# Patient Record
Sex: Male | Born: 1970 | Race: White | Hispanic: No | Marital: Married | Smoking: Never smoker
Health system: Southern US, Community
[De-identification: ages and names within clinical notes are randomized; demographics above are authoritative.]

## PROBLEM LIST (undated history)

## (undated) DIAGNOSIS — J45909 Unspecified asthma, uncomplicated: Secondary | ICD-10-CM

## (undated) DIAGNOSIS — G43909 Migraine, unspecified, not intractable, without status migrainosus: Secondary | ICD-10-CM

## (undated) DIAGNOSIS — E119 Type 2 diabetes mellitus without complications: Secondary | ICD-10-CM

## (undated) DIAGNOSIS — Z87820 Personal history of traumatic brain injury: Secondary | ICD-10-CM

## (undated) DIAGNOSIS — R202 Paresthesia of skin: Secondary | ICD-10-CM

## (undated) DIAGNOSIS — E669 Obesity, unspecified: Secondary | ICD-10-CM

## (undated) DIAGNOSIS — C801 Malignant (primary) neoplasm, unspecified: Secondary | ICD-10-CM

## (undated) DIAGNOSIS — E785 Hyperlipidemia, unspecified: Secondary | ICD-10-CM

## (undated) DIAGNOSIS — R200 Anesthesia of skin: Secondary | ICD-10-CM

## (undated) DIAGNOSIS — T7840XA Allergy, unspecified, initial encounter: Secondary | ICD-10-CM

## (undated) DIAGNOSIS — R7301 Impaired fasting glucose: Secondary | ICD-10-CM

## (undated) HISTORY — DX: Personal history of traumatic brain injury: Z87.820

## (undated) HISTORY — DX: Allergy, unspecified, initial encounter: T78.40XA

## (undated) HISTORY — DX: Hyperlipidemia, unspecified: E78.5

## (undated) HISTORY — PX: EYE SURGERY: SHX253

## (undated) HISTORY — DX: Obesity, unspecified: E66.9

## (undated) HISTORY — DX: Impaired fasting glucose: R73.01

## (undated) HISTORY — DX: Paresthesia of skin: R20.2

## (undated) HISTORY — DX: Anesthesia of skin: R20.0

## (undated) HISTORY — DX: Type 2 diabetes mellitus without complications: E11.9

## (undated) HISTORY — DX: Migraine, unspecified, not intractable, without status migrainosus: G43.909

## (undated) HISTORY — PX: SPINE SURGERY: SHX786

---
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
## 1999-12-25 HISTORY — PX: NASAL SEPTUM SURGERY: SHX37

## 2002-11-04 ENCOUNTER — Encounter: Payer: Self-pay | Admitting: Emergency Medicine

## 2002-11-04 ENCOUNTER — Emergency Department (HOSPITAL_COMMUNITY): Admission: EM | Admit: 2002-11-04 | Discharge: 2002-11-04 | Payer: Self-pay | Admitting: Emergency Medicine

## 2003-11-12 ENCOUNTER — Emergency Department (HOSPITAL_COMMUNITY): Admission: EM | Admit: 2003-11-12 | Discharge: 2003-11-13 | Payer: Self-pay | Admitting: *Deleted

## 2005-11-19 ENCOUNTER — Encounter: Admission: RE | Admit: 2005-11-19 | Discharge: 2005-11-19 | Payer: Self-pay | Admitting: *Deleted

## 2005-12-07 ENCOUNTER — Ambulatory Visit (HOSPITAL_BASED_OUTPATIENT_CLINIC_OR_DEPARTMENT_OTHER): Admission: RE | Admit: 2005-12-07 | Discharge: 2005-12-07 | Payer: Self-pay | Admitting: Orthopedic Surgery

## 2005-12-07 ENCOUNTER — Ambulatory Visit (HOSPITAL_COMMUNITY): Admission: RE | Admit: 2005-12-07 | Discharge: 2005-12-07 | Payer: Self-pay | Admitting: Orthopedic Surgery

## 2005-12-24 HISTORY — PX: KNEE SURGERY: SHX244

## 2006-07-07 ENCOUNTER — Emergency Department: Payer: Self-pay | Admitting: Emergency Medicine

## 2006-09-24 ENCOUNTER — Emergency Department (HOSPITAL_COMMUNITY): Admission: EM | Admit: 2006-09-24 | Discharge: 2006-09-25 | Payer: Self-pay | Admitting: Emergency Medicine

## 2007-06-24 ENCOUNTER — Ambulatory Visit: Payer: Self-pay | Admitting: Internal Medicine

## 2007-07-11 ENCOUNTER — Ambulatory Visit: Payer: Self-pay | Admitting: Internal Medicine

## 2007-07-25 ENCOUNTER — Ambulatory Visit: Payer: Self-pay | Admitting: Internal Medicine

## 2007-08-25 ENCOUNTER — Ambulatory Visit: Payer: Self-pay | Admitting: Internal Medicine

## 2007-10-20 ENCOUNTER — Emergency Department: Payer: Self-pay | Admitting: Emergency Medicine

## 2007-10-20 ENCOUNTER — Other Ambulatory Visit: Payer: Self-pay

## 2007-10-25 ENCOUNTER — Ambulatory Visit: Payer: Self-pay | Admitting: Internal Medicine

## 2011-11-23 ENCOUNTER — Ambulatory Visit: Payer: Self-pay

## 2012-04-25 ENCOUNTER — Ambulatory Visit: Payer: Self-pay | Admitting: Emergency Medicine

## 2012-05-01 ENCOUNTER — Ambulatory Visit: Payer: Self-pay | Admitting: Urology

## 2012-10-06 ENCOUNTER — Ambulatory Visit: Payer: Self-pay

## 2013-03-04 ENCOUNTER — Emergency Department: Payer: Self-pay | Admitting: Emergency Medicine

## 2013-07-09 ENCOUNTER — Emergency Department: Payer: Self-pay | Admitting: Emergency Medicine

## 2013-07-09 LAB — BASIC METABOLIC PANEL
Anion Gap: 5 — ABNORMAL LOW (ref 7–16)
Chloride: 105 mmol/L (ref 98–107)
EGFR (African American): >60
EGFR (Non-African Amer.): >60
Glucose: 144 mg/dL — ABNORMAL HIGH (ref 65–99)
Osmolality: 276 (ref 275–301)
Potassium: 3.7 mmol/L (ref 3.5–5.1)

## 2013-07-09 LAB — CBC
HGB: 16.5 g/dL (ref 13.0–18.0)
MCH: 29.8 pg (ref 26.0–34.0)
MCHC: 34.5 g/dL (ref 32.0–36.0)
Platelet: 200 10*3/uL (ref 150–440)
RBC: 5.53 10*6/uL (ref 4.40–5.90)
RDW: 13.1 % (ref 11.5–14.5)

## 2013-07-09 LAB — TROPONIN I: Troponin-I: <0.02 ng/mL

## 2013-12-24 DIAGNOSIS — M5020 Other cervical disc displacement, unspecified cervical region: Secondary | ICD-10-CM

## 2013-12-24 HISTORY — DX: Other cervical disc displacement, unspecified cervical region: M50.20

## 2015-01-13 ENCOUNTER — Ambulatory Visit: Payer: Self-pay | Admitting: Family Medicine

## 2015-01-24 HISTORY — PX: LAMINECTOMY: SHX219

## 2015-01-25 ENCOUNTER — Other Ambulatory Visit: Payer: Self-pay | Admitting: Neurosurgery

## 2015-01-25 DIAGNOSIS — M5412 Radiculopathy, cervical region: Secondary | ICD-10-CM

## 2015-01-31 ENCOUNTER — Ambulatory Visit
Admission: RE | Admit: 2015-01-31 | Discharge: 2015-01-31 | Disposition: A | Discharge status: Home / Self Care | Payer: 59 | Source: Ambulatory Visit | Attending: Neurosurgery | Admitting: Neurosurgery

## 2015-01-31 ENCOUNTER — Ambulatory Visit
Admission: RE | Admit: 2015-01-31 | Discharge: 2015-01-31 | Disposition: A | Discharge status: Home / Self Care | Payer: Self-pay | Source: Ambulatory Visit | Attending: Neurosurgery | Admitting: Neurosurgery

## 2015-01-31 DIAGNOSIS — M5412 Radiculopathy, cervical region: Secondary | ICD-10-CM

## 2015-01-31 MED ORDER — DIAZEPAM 5 MG PO TABS
10.0000 mg | ORAL_TABLET | Freq: Once | ORAL | Status: AC
  Administered 2015-01-31: 10 mg via ORAL

## 2015-01-31 MED ORDER — IOHEXOL 300 MG/ML  SOLN
9.0000 mL | Freq: Once | INTRAMUSCULAR | Status: AC | PRN
  Administered 2015-01-31: 9 mL via INTRATHECAL

## 2015-01-31 NOTE — Discharge Instructions (Signed)

## 2015-06-15 DIAGNOSIS — R7301 Impaired fasting glucose: Secondary | ICD-10-CM | POA: Insufficient documentation

## 2015-06-15 DIAGNOSIS — E785 Hyperlipidemia, unspecified: Secondary | ICD-10-CM | POA: Insufficient documentation

## 2015-06-15 DIAGNOSIS — E1169 Type 2 diabetes mellitus with other specified complication: Secondary | ICD-10-CM | POA: Insufficient documentation

## 2015-06-15 DIAGNOSIS — G43909 Migraine, unspecified, not intractable, without status migrainosus: Secondary | ICD-10-CM | POA: Insufficient documentation

## 2015-06-15 DIAGNOSIS — E669 Obesity, unspecified: Secondary | ICD-10-CM | POA: Insufficient documentation

## 2015-06-22 ENCOUNTER — Encounter: Payer: Self-pay | Admitting: Family Medicine

## 2015-06-22 ENCOUNTER — Ambulatory Visit (INDEPENDENT_AMBULATORY_CARE_PROVIDER_SITE_OTHER): Payer: 59 | Admitting: Family Medicine

## 2015-06-22 VITALS — BP 117/78 | HR 69 | Temp 98.2°F | Wt 259.0 lb

## 2015-06-22 DIAGNOSIS — R634 Abnormal weight loss: Secondary | ICD-10-CM

## 2015-06-22 DIAGNOSIS — R200 Anesthesia of skin: Secondary | ICD-10-CM

## 2015-06-22 DIAGNOSIS — E669 Obesity, unspecified: Secondary | ICD-10-CM | POA: Diagnosis not present

## 2015-06-22 DIAGNOSIS — E785 Hyperlipidemia, unspecified: Secondary | ICD-10-CM | POA: Diagnosis not present

## 2015-06-22 DIAGNOSIS — R7301 Impaired fasting glucose: Secondary | ICD-10-CM | POA: Diagnosis not present

## 2015-06-22 DIAGNOSIS — M542 Cervicalgia: Secondary | ICD-10-CM

## 2015-06-22 DIAGNOSIS — R208 Other disturbances of skin sensation: Secondary | ICD-10-CM

## 2015-06-22 NOTE — Patient Instructions (Addendum)
Return in 6 months Try to limit saturated fats and sweets and simple carbs We recommend no more than 3 eggs per week Flu shot recommended in the Fall I've put in a referral to Dr. Annette Stable for the numbness in the left thumb, neck tightness and pain

## 2015-06-22 NOTE — Assessment & Plan Note (Addendum)
Check lipids today; no meds; encouraged fewer saturated fats

## 2015-06-22 NOTE — Assessment & Plan Note (Signed)
Check glucose today and A1C

## 2015-06-22 NOTE — Progress Notes (Signed)
BP 117/78 mmHg  Pulse 69  Temp(Src) 98.2 F (36.8 C)  Wt 259 lb (117.482 kg)  SpO2 98%   Subjective:    Patient ID: Ethan Perez, male    DOB: 19-May-1971, 44 y.o.   MRN: [id number]  HPI: Ethan Perez is a 44 y.o. male  Chief Complaint  Patient presents with  . Hyperlipidemia  . Hyperglycemia    IFG   He was doing diet and exercise; he stopped about a month ago; he has lost 40 pounds or so; he did a 3 day diet; he was doing carb restriction; he doesn't think he has cancer, no blood in th stool; he worked hard to lose the weight; followed a plan and stuck with it and all expected weight loss; stopped one month ago; weight leveled off, he fluctuates five pounds either way now, but staying steady  He stopped taking the blood pressure medicine (was taking that for headaches); 100/56 and felt light-headed and passed out, 2 months ago; he saw the surgeon and he thoughts that is what it was; stopped the BP medicine and have felt fine since then; had only had two headaches since stopping the med  High cholesterol; still eating foods with saturated fats; no bologna; occasional bacon; occasional hamburger; not much fried chicken, mostly grilled or baked; eggs every morning; he is fasting now  High glucose, prediabetes; not checking sugars; no dry mouth, no unusual excessive thirst  Relevant past medical, surgical, family and social history reviewed and updated as indicated. Interim medical history since our last visit reviewed. Allergies and medications reviewed and updated.  Review of Systems  Constitutional: Negative for unexpected weight change (patient worked hard for this).  Respiratory: Negative for shortness of breath.   Cardiovascular: Negative for chest pain.  Gastrointestinal: Negative for blood in stool.  Genitourinary: Negative for hematuria.   Per HPI unless specifically indicated above     Objective:    BP 117/78 mmHg  Pulse 69  Temp(Src) 98.2 F (36.8  C)  Wt 259 lb (117.482 kg)  SpO2 98%  Wt Readings from Last 3 Encounters:  06/22/15 259 lb (117.482 kg)  03/07/15 293 lb (132.904 kg)  01/31/15 300 lb (136.079 kg)    Physical Exam  Constitutional: He appears well-developed and well-nourished. No distress.  Weight loss noted  HENT:  Head: Normocephalic and atraumatic.  Nose: No rhinorrhea.  Mouth/Throat: Mucous membranes are normal.  Eyes: EOM are normal. No scleral icterus.  Neck: No thyromegaly present.  Cardiovascular: Normal rate, regular rhythm and normal heart sounds.   No murmur heard. Pulmonary/Chest: Effort normal and breath sounds normal. No respiratory distress. He has no wheezes.  Abdominal: Soft. Bowel sounds are normal. He exhibits no distension. There is no tenderness.  Musculoskeletal: He exhibits no edema.  Neurological: He is alert. He displays no tremor. He exhibits normal muscle tone.  Thumb to pinky strength 5/5, thumb to index finger strength 5/5; normal gait  Skin: Skin is warm and dry. No rash noted. He is not diaphoretic. No cyanosis.  Psychiatric: He has a normal mood and affect. His behavior is normal. Judgment and thought content normal.  Nursing note and vitals reviewed.     Assessment & Plan:   Problem List Items Addressed This Visit      Endocrine   IFG (impaired fasting glucose) - Primary    Check glucose today and A1C      Relevant Orders   Comprehensive metabolic panel   Hgb  A1c w/o eAG     Other   Obesity    Significant weight loss following what sounds like a pretty strict low calorie diet; will check TSH today; he denies red flags otherwise for cancer and believes the weight loss is explainable by what he did intentionally; he is still in the obese category, but much much improved; this is the single best thing he can do to decrease risk of developing diabetes      Hyperlipidemia    Check lipids today; no meds; encouraged fewer saturated fats      Relevant Orders   Lipid Panel  w/o Chol/HDL Ratio    Other Visit Diagnoses    Recent weight loss        patient denies red flags, does not think he has cancer, worked hard to achieve this weight loss; will check TSH anyway    Relevant Orders    TSH    Neck pain, bilateral        refer back to Dr. Annette Stable, neurosurgeon, at patient's request    Relevant Orders    Ambulatory referral to Neurosurgery    Numbness of left thumb        refer back to Dr. Annette Stable, neurosurgeon since he is having neck pain and left sided numbness; I'll check a TSH too    Relevant Orders    Ambulatory referral to Neurosurgery       Orders Placed This Encounter  Procedures  . Lipid Panel w/o Chol/HDL Ratio  . TSH  . Comprehensive metabolic panel  . Hgb A1c w/o eAG  . Ambulatory referral to Neurosurgery   Follow up plan: Return in about 6 months (around 12/22/2015) for prediabetes, high cholesterol.

## 2015-06-22 NOTE — Assessment & Plan Note (Signed)
Significant weight loss following what sounds like a pretty strict low calorie diet; will check TSH today; he denies red flags otherwise for cancer and believes the weight loss is explainable by what he did intentionally; he is still in the obese category, but much much improved; this is the single best thing he can do to decrease risk of developing diabetes

## 2015-06-23 LAB — COMPREHENSIVE METABOLIC PANEL
ALT: 19 IU/L (ref 0–44)
AST: 19 IU/L (ref 0–40)
Albumin/Globulin Ratio: 1.5 (ref 1.1–2.5)
Albumin: 4.3 g/dL (ref 3.5–5.5)
Alkaline Phosphatase: 76 IU/L (ref 39–117)
BUN / CREAT RATIO: 8 — AB (ref 9–20)
BUN: 7 mg/dL (ref 6–24)
Bilirubin Total: 0.6 mg/dL (ref 0.0–1.2)
CALCIUM: 9.7 mg/dL (ref 8.7–10.2)
CHLORIDE: 98 mmol/L (ref 97–108)
CO2: 27 mmol/L (ref 18–29)
Creatinine, Ser: 0.92 mg/dL (ref 0.76–1.27)
GFR calc Af Amer: 117 mL/min/{1.73_m2} (ref >59)
GFR, EST NON AFRICAN AMERICAN: 102 mL/min/{1.73_m2} (ref >59)
Globulin, Total: 2.9 g/dL (ref 1.5–4.5)
Glucose: 94 mg/dL (ref 65–99)
Potassium: 4.4 mmol/L (ref 3.5–5.2)
Sodium: 138 mmol/L (ref 134–144)
TOTAL PROTEIN: 7.2 g/dL (ref 6.0–8.5)

## 2015-06-23 LAB — LIPID PANEL W/O CHOL/HDL RATIO
Cholesterol, Total: 195 mg/dL (ref 100–199)
HDL: 34 mg/dL — ABNORMAL LOW (ref >39)
LDL Calculated: 128 mg/dL — ABNORMAL HIGH (ref 0–99)
Triglycerides: 167 mg/dL — ABNORMAL HIGH (ref 0–149)
VLDL CHOLESTEROL CAL: 33 mg/dL (ref 5–40)

## 2015-06-23 LAB — TSH: TSH: 1.67 u[IU]/mL (ref 0.450–4.500)

## 2015-06-23 LAB — HGB A1C W/O EAG: HEMOGLOBIN A1C: 6.1 % — AB (ref 4.8–5.6)

## 2015-06-28 ENCOUNTER — Telehealth: Payer: Self-pay

## 2015-06-28 NOTE — Telephone Encounter (Signed)
He called asking about his lab results.

## 2015-06-29 NOTE — Telephone Encounter (Signed)
Thank you so much Let patient know that I'm very pleased with his labs overall He has brought his 3 month average blood sugar down from 6.8 to 6.1; that's fantastic He has brought his HDL up from 31 to 34, also a big move in the right direction His triglycerides have also come down His LDL went up some, not sure if related to some animal fat during his diet ?, but it's reasonable (under 130); ideally, we'd like his LDL under 100, so encourage less animal fats which are usually saturated fats (cheese, bacon, sausage, pepperoni, hamburger, steak, etc.) Next labs can be in 6 months since he is doing so well

## 2015-06-29 NOTE — Telephone Encounter (Signed)
His lab results from 03/07/15: HgbA1c: 6.8% Glucose: 111 Total Chol: 176 Triglycerides: 171 HDL: 31 LDL: 111

## 2015-06-29 NOTE — Telephone Encounter (Signed)
Patient notified

## 2015-06-29 NOTE — Telephone Encounter (Signed)
Amy, I can't get into [facility]. Could you please pull up his last lipid panel, glucose, and A1C so I can compare these for him? Thank you

## 2015-07-01 ENCOUNTER — Telehealth: Payer: Self-pay | Admitting: Family Medicine

## 2015-07-26 ENCOUNTER — Other Ambulatory Visit: Payer: Self-pay | Admitting: Neurosurgery

## 2015-07-26 DIAGNOSIS — M542 Cervicalgia: Secondary | ICD-10-CM

## 2015-07-28 ENCOUNTER — Ambulatory Visit
Admission: RE | Admit: 2015-07-28 | Discharge: 2015-07-28 | Disposition: A | Discharge status: Home / Self Care | Payer: 59 | Source: Ambulatory Visit | Attending: Neurosurgery | Admitting: Neurosurgery

## 2015-07-28 DIAGNOSIS — M542 Cervicalgia: Secondary | ICD-10-CM

## 2015-12-21 ENCOUNTER — Ambulatory Visit (INDEPENDENT_AMBULATORY_CARE_PROVIDER_SITE_OTHER): Payer: 59 | Admitting: Family Medicine

## 2015-12-21 ENCOUNTER — Encounter: Payer: Self-pay | Admitting: Family Medicine

## 2015-12-21 VITALS — BP 133/84 | HR 71 | Temp 98.0°F | Wt 285.0 lb

## 2015-12-21 DIAGNOSIS — E669 Obesity, unspecified: Secondary | ICD-10-CM | POA: Diagnosis not present

## 2015-12-21 DIAGNOSIS — R7301 Impaired fasting glucose: Secondary | ICD-10-CM

## 2015-12-21 DIAGNOSIS — Z23 Encounter for immunization: Secondary | ICD-10-CM

## 2015-12-21 DIAGNOSIS — D492 Neoplasm of unspecified behavior of bone, soft tissue, and skin: Secondary | ICD-10-CM

## 2015-12-21 DIAGNOSIS — D485 Neoplasm of uncertain behavior of skin: Secondary | ICD-10-CM | POA: Diagnosis not present

## 2015-12-21 DIAGNOSIS — E785 Hyperlipidemia, unspecified: Secondary | ICD-10-CM | POA: Diagnosis not present

## 2015-12-21 NOTE — Assessment & Plan Note (Addendum)
Check A1c and glucose today; healthy eating encouraged; his weight gain is concerning, and weight loss was encouraged

## 2015-12-21 NOTE — Patient Instructions (Addendum)
I'll recommend trying to eat no more than three egg yolks a week Try to limit saturated fats in your diet (bologna, hot dogs, barbeque, cheeseburgers, hamburgers, steak, bacon, sausage, cheese, etc.) and get more fresh fruits, vegetables, and whole grains Check out the information at familydoctor.org entitled "What It Takes to Lose Weight" Try to lose between 1-2 pounds per week by taking in fewer calories and burning off more calories You can succeed by limiting portions, limiting foods dense in calories and fat, becoming more active, and drinking 8 glasses of water a day (64 ounces) Don't skip meals, especially breakfast, as skipping meals may alter your metabolism Do not use over-the-counter weight loss pills or gimmicks that claim rapid weight loss A healthy BMI (or body mass index) is between 18.5 and 24.9 You can calculate your ideal BMI at the [place] website [employer]fr Try to use a tiny dab or lamisil or lotrimin on the spot on your cheek for the next week We'll get you in to see dermatologist If you increase your activity, start slowly and build up gradually You received the flu shot today; it should protect you against the flu virus over the coming months; it will take about two weeks for antibodies to develop; do try to stay away from hospitals, nursing homes, and daycares during peak flu season; taking extra vitamin C daily during flu season may help you avoid getting sick

## 2015-12-21 NOTE — Assessment & Plan Note (Addendum)
Healthy eating encouraged but he does not sound motivated to change his diet, check lipids today; more fiber gently recommended

## 2015-12-21 NOTE — Progress Notes (Signed)
BP 133/84 mmHg  Pulse 71  Temp(Src) 98 F (36.7 C)  Wt 285 lb (129.275 kg)  SpO2 96%   Subjective:    Patient ID: Ethan Perez, male    DOB: Aug 06, 1971, 44 y.o.   MRN: [id number]  HPI: Ethan Perez is a 44 y.o. male  Chief Complaint  Patient presents with  . IFG    I'm just here for my recheck  . Hyperlipidemia   Since last visit, went to urgent care; neck problems; then saw Dr. Annette Stable; sprain in the neck and all better; hit a pole while trying to concentrate on something else  Prediabetes; mother, brother, lots of people on mother's side had diabetes; father's side okay without diabetes; some lemonade, but not much, no soft drinks; trying to do wheat; does like potatoes; he is fasting  High cholesterol; two eggs a day sometimes, maybe ten a day; normally has ham in the omelet; not milk drinker, but rarely skim milk; does love his cheese  Obesity; has gained weight; hit his head and healthy again and back again; threw a monkey wrench in his exercising but he has hopes to get back into it soon  Allergies; taking zyrtec pretty much every day that he works  Place on right cheek just appeared a week ago; a little itching, tried nothing  Relevant past medical, surgical, family and social history reviewed and updated as indicated. Interim medical history since our last visit reviewed. Allergies and medications reviewed and updated.  Review of Systems Per HPI unless specifically indicated above     Objective:    BP 133/84 mmHg  Pulse 71  Temp(Src) 98 F (36.7 C)  Wt 285 lb (129.275 kg)  SpO2 96%  Wt Readings from Last 3 Encounters:  12/21/15 285 lb (129.275 kg)  07/28/15 255 lb (115.667 kg)  06/22/15 259 lb (117.482 kg)   body mass index is 34.71 kg/(m^2).  Physical Exam  Constitutional: He appears well-developed and well-nourished.  Weight gain of 30 pounds in less than 5 months  HENT:  Head: Normocephalic and atraumatic.  Mouth/Throat: Oropharynx is  clear and moist.  Eyes: EOM are normal. No scleral icterus.  Neck: No thyromegaly present.  Cardiovascular: Normal rate and regular rhythm.   Pulmonary/Chest: Effort normal and breath sounds normal.  Abdominal: He exhibits no distension.  Musculoskeletal: Normal range of motion.  Neurological: He is alert.  Skin: Skin is warm. Lesion (right cheek, irregular erythematous lesion (under the right eye)) noted. No pallor.  Psychiatric: He has a normal mood and affect.      Assessment & Plan:   Problem List Items Addressed This Visit      Endocrine   IFG (impaired fasting glucose) - Primary    Check A1c and glucose today; healthy eating encouraged; his weight gain is concerning, and weight loss was encouraged      Relevant Orders   Comprehensive metabolic panel (Completed)   Hgb A1c w/o eAG (Completed)     Other   Obesity    Encouragement given to work on weight loss; he does not sound incredibly motivated to change his diet; if he starts to increase activity, start low and build up slowly      Hyperlipidemia    Healthy eating encouraged but he does not sound motivated to change his diet, check lipids today; more fiber gently recommended      Relevant Orders   Lipid Panel w/o Chol/HDL Ratio (Completed)    Other Visit Diagnoses  Needs flu shot        flu shot given by CMA    Encounter for immunization        Neoplasm of skin of right cheek        location is one where an AK might develop; unusual if fungal infection; will refer to derm    Relevant Orders    Ambulatory referral to Dermatology       Follow up plan: Return in about 6 months (around 06/20/2016) for thirty minute follow-up with fasting labs.  Orders Placed This Encounter  Procedures  . Flu Vaccine QUAD 36+ mos IM  . Lipid Panel w/o Chol/HDL Ratio  . Comprehensive metabolic panel  . Hgb A1c w/o eAG  . Ambulatory referral to Dermatology   An after-visit summary was printed and given to the patient at  [facility].  Please see the patient instructions which may contain other information and recommendations beyond what is mentioned above in the assessment and plan.

## 2015-12-22 ENCOUNTER — Encounter: Payer: Self-pay | Admitting: Family Medicine

## 2015-12-22 LAB — COMPREHENSIVE METABOLIC PANEL
ALT: 37 IU/L (ref 0–44)
AST: 26 IU/L (ref 0–40)
Albumin/Globulin Ratio: 1.4 (ref 1.1–2.5)
Albumin: 4.3 g/dL (ref 3.5–5.5)
Alkaline Phosphatase: 74 IU/L (ref 39–117)
BUN/Creatinine Ratio: 11 (ref 9–20)
BUN: 10 mg/dL (ref 6–24)
Bilirubin Total: 0.6 mg/dL (ref 0.0–1.2)
CALCIUM: 9.5 mg/dL (ref 8.7–10.2)
CO2: 23 mmol/L (ref 18–29)
CREATININE: 0.95 mg/dL (ref 0.76–1.27)
Chloride: 101 mmol/L (ref 96–106)
GFR calc Af Amer: 112 mL/min/{1.73_m2} (ref >59)
GFR, EST NON AFRICAN AMERICAN: 97 mL/min/{1.73_m2} (ref >59)
GLOBULIN, TOTAL: 3 g/dL (ref 1.5–4.5)
GLUCOSE: 99 mg/dL (ref 65–99)
POTASSIUM: 4.6 mmol/L (ref 3.5–5.2)
SODIUM: 139 mmol/L (ref 134–144)
Total Protein: 7.3 g/dL (ref 6.0–8.5)

## 2015-12-22 LAB — LIPID PANEL W/O CHOL/HDL RATIO
Cholesterol, Total: 196 mg/dL (ref 100–199)
HDL: 39 mg/dL — ABNORMAL LOW (ref >39)
LDL Calculated: 121 mg/dL — ABNORMAL HIGH (ref 0–99)
Triglycerides: 180 mg/dL — ABNORMAL HIGH (ref 0–149)
VLDL CHOLESTEROL CAL: 36 mg/dL (ref 5–40)

## 2015-12-22 LAB — HGB A1C W/O EAG: HEMOGLOBIN A1C: 5.7 % — AB (ref 4.8–5.6)

## 2015-12-24 NOTE — Assessment & Plan Note (Signed)
Encouragement given to work on weight loss; he does not sound incredibly motivated to change his diet; if he starts to increase activity, start low and build up slowly

## 2016-02-29 ENCOUNTER — Encounter: Payer: Self-pay | Admitting: *Deleted

## 2016-02-29 ENCOUNTER — Ambulatory Visit
Admission: EM | Admit: 2016-02-29 | Discharge: 2016-02-29 | Disposition: A | Discharge status: Home / Self Care | Payer: 59 | Attending: Family Medicine | Admitting: Family Medicine

## 2016-02-29 DIAGNOSIS — J0100 Acute maxillary sinusitis, unspecified: Secondary | ICD-10-CM | POA: Diagnosis not present

## 2016-02-29 DIAGNOSIS — R040 Epistaxis: Secondary | ICD-10-CM

## 2016-02-29 MED ORDER — ALBUTEROL SULFATE HFA 108 (90 BASE) MCG/ACT IN AERS: 1.0000 | INHALATION_SPRAY | Freq: Four times a day (QID) | RESPIRATORY_TRACT | Status: DC | PRN

## 2016-02-29 MED ORDER — AMOXICILLIN 875 MG PO TABS: 875.0000 mg | ORAL_TABLET | Freq: Two times a day (BID) | ORAL | Status: DC

## 2016-02-29 NOTE — ED Provider Notes (Signed)
CSN: [id number]     Arrival date & time 02/29/16  [id number] History   First MD Initiated Contact with Patient 02/29/16 1954     Chief Complaint  Patient presents with  . Epistaxis  . Headache   (Consider location/radiation/quality/duration/timing/severity/associated sxs/prior Treatment) Patient is a 45 y.o. male presenting with URI. The history is provided by the patient.  URI Presenting symptoms: congestion, cough, facial pain and fatigue   Fatigue:    Severity:  Moderate   Duration:  2 weeks   Timing:  Constant   Progression:  Worsening Severity:  Moderate Onset quality:  Sudden Duration:  2 weeks Timing:  Constant Progression:  Worsening Chronicity:  New Relieved by:  Nothing Ineffective treatments:  OTC medications Associated symptoms: headaches and sinus pain   Associated symptoms comment:  Intermittent nose bleeds (after blowing nose very hard) Risk factors: not elderly, no chronic cardiac disease, no chronic kidney disease, no chronic respiratory disease, no diabetes mellitus, no immunosuppression, no recent illness, no recent travel and no sick contacts     Past Medical History  Diagnosis Date  . Migraines   . IFG (impaired fasting glucose)   . Obesity   . Hyperlipidemia   . History of concussion   . Numbness and tingling of left thumb    Past Surgical History  Procedure Laterality Date  . Tonsillectomy  1976  . Nasal septum surgery  2001  . Laminectomy  Feb 2016  . Knee surgery Left 2007    Orthoscopic   Family History  Problem Relation Age of Onset  . Cirrhosis Mother     on diaylsis  . Seizures Mother     s/p hit her head  . Diabetes Mother   . Hypertension Mother   . Kidney disease Mother   . Migraines Father     with aura  . Diabetes Brother   . Diabetes Maternal Grandmother   . Heart disease Maternal Grandmother   . Diabetes Maternal Grandfather   . Heart disease Maternal Grandfather   . Stroke Maternal Grandfather   . Heart disease Paternal  Grandmother   . Heart disease Paternal Grandfather   . Lung disease Paternal Grandfather     worked in a [employer] park manager  . Alcohol abuse Paternal Grandfather   . Cancer Paternal Aunt    Social History  Substance Use Topics  . Smoking status: Never Smoker   . Smokeless tobacco: Never Used  . Alcohol Use: No    Review of Systems  Constitutional: Positive for fatigue.  HENT: Positive for congestion.   Respiratory: Positive for cough.   Neurological: Positive for headaches.    Allergies  Tramadol and Compazine  Home Medications   Prior to Admission medications   Medication Sig Start Date End Date Taking? Authorizing Provider  aspirin-acetaminophen-caffeine (EXCEDRIN MIGRAINE) [phone] MG per tablet Take 2 tablets by mouth every 6 (six) hours as needed for headache.   Yes Historical Provider, MD  cetirizine (ZYRTEC) 10 MG tablet Take 10 mg by mouth daily as needed for allergies.    Yes Historical Provider, MD  albuterol (PROVENTIL HFA;VENTOLIN HFA) 108 (90 Base) MCG/ACT inhaler Inhale 1-2 puffs into the lungs every 6 (six) hours as needed for wheezing or shortness of breath. 02/29/16   Norval Gable, MD  amoxicillin (AMOXIL) 875 MG tablet Take 1 tablet (875 mg total) by mouth 2 (two) times daily. 02/29/16   Norval Gable, MD  triamcinolone cream (KENALOG) 0.5 % Apply 1 application topically 3 (three) times daily.  Historical Provider, MD   Meds Ordered and Administered this Visit  Medications - No data to display  BP 134/86 mmHg  Pulse 88  Temp(Src) 97.7 F (36.5 C) (Oral)  Resp 16  Ht 6\' 4"  (1.93 m)  Wt 283 lb (128.368 kg)  BMI 34.46 kg/m2  SpO2 96% No data found.   Physical Exam  Constitutional: He appears well-developed and well-nourished. No distress.  HENT:  Head: Normocephalic and atraumatic.  Right Ear: Tympanic membrane, external ear and ear canal normal.  Left Ear: Tympanic membrane, external ear and ear canal normal.  Nose: Epistaxis (dried blood in anterior  nares bilaterally) is observed. Right sinus exhibits maxillary sinus tenderness and frontal sinus tenderness. Left sinus exhibits maxillary sinus tenderness and frontal sinus tenderness.  Mouth/Throat: Uvula is midline, oropharynx is clear and moist and mucous membranes are normal. No oropharyngeal exudate, posterior oropharyngeal edema, posterior oropharyngeal erythema or tonsillar abscesses.  Eyes: Conjunctivae and EOM are normal. Pupils are equal, round, and reactive to light. Right eye exhibits no discharge. Left eye exhibits no discharge. No scleral icterus.  Neck: Normal range of motion. Neck supple. No tracheal deviation present. No thyromegaly present.  Cardiovascular: Normal rate, regular rhythm and normal heart sounds.   Pulmonary/Chest: Effort normal and breath sounds normal. No stridor. No respiratory distress. He has no wheezes. He has no rales. He exhibits no tenderness.  Lymphadenopathy:    He has no cervical adenopathy.  Neurological: He is alert.  Skin: Skin is warm and dry. No rash noted. He is not diaphoretic.  Nursing note and vitals reviewed.   ED Course  Procedures (including critical care time)  Labs Review Labs Reviewed - No data to display  Imaging Review No results found.   Visual Acuity Review  Right Eye Distance:   Left Eye Distance:   Bilateral Distance:    Right Eye Near:   Left Eye Near:    Bilateral Near:         MDM   1. Acute maxillary sinusitis, recurrence not specified   2. Epistaxis     Discharge Medication List as of 02/29/2016  8:15 PM    START taking these medications   Details  albuterol (PROVENTIL HFA;VENTOLIN HFA) 108 (90 Base) MCG/ACT inhaler Inhale 1-2 puffs into the lungs every 6 (six) hours as needed for wheezing or shortness of breath., Starting 02/29/2016, Until Discontinued, Normal    amoxicillin (AMOXIL) 875 MG tablet Take 1 tablet (875 mg total) by mouth 2 (two) times daily., Starting 02/29/2016, Until Discontinued, Normal        1. diagnosis reviewed with patient 2. rx as per orders above; reviewed possible side effects, interactions, risks and benefits  3. Recommend supportive treatment with saline nasal rinses; Afrin prn nosebleed; otc analgesics prn 4. Follow-up prn if symptoms worsen or don't improve      Norval Gable, MD 02/29/16 2036

## 2016-02-29 NOTE — ED Notes (Signed)
Intermittent nosebleed for past 2-3 weeks. Tonight headache then nosebleed.

## 2016-03-15 ENCOUNTER — Encounter: Payer: Self-pay | Admitting: Emergency Medicine

## 2016-03-15 ENCOUNTER — Ambulatory Visit
Admission: EM | Admit: 2016-03-15 | Discharge: 2016-03-15 | Disposition: A | Discharge status: Home / Self Care | Payer: 59 | Attending: Emergency Medicine | Admitting: Emergency Medicine

## 2016-03-15 DIAGNOSIS — J324 Chronic pansinusitis: Secondary | ICD-10-CM

## 2016-03-15 MED ORDER — DOXYCYCLINE HYCLATE 100 MG PO CAPS: 100.0000 mg | ORAL_CAPSULE | Freq: Two times a day (BID) | ORAL | Status: DC

## 2016-03-15 MED ORDER — MOMETASONE FUROATE 50 MCG/ACT NA SUSP: 2.0000 | Freq: Every day | NASAL | Status: DC

## 2016-03-15 NOTE — ED Provider Notes (Signed)
HPI  SUBJECTIVE:  Ethan Perez is a 45 y.o. male who presents with nasal congestion, purulent nasal congestion, postnasal drip, ST, nonproductive cough, left more than right frontal sinus pain/pressure for 6 weeks. Reports intermittent bilateral ear pain, and states that his upper teeth hurt from time to time. He reports itchy, watery eyes, sneezing. He is taking Zyrtec on a regular basis for this. He was seen here on 3/80, found to have sinusitis, was treated with amoxicillin. Patient states that he got somewhat better, but then got worse again.  No fevers,  other HA. If not doing saline nasal irrigation or using a nasal steroid. States feels simlar to prev episodes of sinusitis. PMH seasonal allergies, sinus infections. No h/o DM. Pt is not a smoker.    Past Medical History  Diagnosis Date  . Migraines   . IFG (impaired fasting glucose)   . Obesity   . Hyperlipidemia   . History of concussion   . Numbness and tingling of left thumb     Past Surgical History  Procedure Laterality Date  . Tonsillectomy  1976  . Nasal septum surgery  2001  . Laminectomy  Feb 2016  . Knee surgery Left 2007    Orthoscopic    Family History  Problem Relation Age of Onset  . Cirrhosis Mother     on diaylsis  . Seizures Mother     s/p hit her head  . Diabetes Mother   . Hypertension Mother   . Kidney disease Mother   . Migraines Father     with aura  . Diabetes Brother   . Diabetes Maternal Grandmother   . Heart disease Maternal Grandmother   . Diabetes Maternal Grandfather   . Heart disease Maternal Grandfather   . Stroke Maternal Grandfather   . Heart disease Paternal Grandmother   . Heart disease Paternal Grandfather   . Lung disease Paternal Grandfather     worked in a Theme park manager  . Alcohol abuse Paternal Grandfather   . Cancer Paternal Aunt     Social History  Substance Use Topics  . Smoking status: Never Smoker   . Smokeless tobacco: Never Used  . Alcohol Use: No     No current facility-administered medications for this encounter.  Current outpatient prescriptions:  .  albuterol (PROVENTIL HFA;VENTOLIN HFA) 108 (90 Base) MCG/ACT inhaler, Inhale 1-2 puffs into the lungs every 6 (six) hours as needed for wheezing or shortness of breath., Disp: 1 Inhaler, Rfl: 0 .  aspirin-acetaminophen-caffeine (EXCEDRIN MIGRAINE) 250-250-65 MG per tablet, Take 2 tablets by mouth every 6 (six) hours as needed for headache., Disp: , Rfl:  .  doxycycline (VIBRAMYCIN) 100 MG capsule, Take 1 capsule (100 mg total) by mouth 2 (two) times daily. X 7 days, Disp: 14 capsule, Rfl: 0 .  mometasone (NASONEX) 50 MCG/ACT nasal spray, Place 2 sprays into the nose daily., Disp: 17 g, Rfl: 0 .  triamcinolone cream (KENALOG) 0.5 %, Apply 1 application topically 3 (three) times daily., Disp: , Rfl:  .  [DISCONTINUED] cetirizine (ZYRTEC) 10 MG tablet, Take 10 mg by mouth daily as needed for allergies. , Disp: , Rfl:   Allergies  Allergen Reactions  . Tramadol Other (See Comments)    Sleep for 24 hr.  . Compazine [Prochlorperazine Edisylate] Other (See Comments)    Causes involuntary muscle movements.     ROS  As noted in HPI.   Physical Exam  BP 134/86 mmHg  Pulse 84  Temp(Src) 98.3 F (  36.8 C) (Tympanic)  Resp 16  Ht 6\' 4"  (1.93 m)  Wt 285 lb (129.275 kg)  BMI 34.71 kg/m2  SpO2 95%  Constitutional: Well developed, well nourished, no acute distress Eyes:  EOMI, conjunctiva normal bilaterally HENT: Normocephalic, atraumatic,mucus membranes moist. TMs normal bilaterally. +purulent nasal congestion. Swollen red turbinates. + maxillary sinus tenderness L>R, +  frontal sinus tenderness L>R Oropharynx mildly erythematous  - postnasal drip. + cobblestoning Respiratory: Normal inspiratory effort Cardiovascular: Normal rate GI: nondistended skin: No rash, skin intact Musculoskeletal: no deformities Neurologic: Alert & oriented x 3, no focal neuro deficits Psychiatric: Speech  and behavior appropriate   ED Course   Medications - No data to display  Orders Placed This Encounter  Procedures  . Ambulatory referral to ENT    Referral Priority:  Routine    Referral Type:  Consultation    Referral Reason:  Specialty Services Required    Requested Specialty:  Otolaryngology    Number of Visits Requested:  1    No results found for this or any previous visit (from the past 24 hour(s)). No results found.  ED Clinical Impression  Chronic pansinusitis   ED Assessment/Plan  Previous records reviewed. As noted in history of present illness   Pt with indications for abx- had partial response to previous antibiotic treatment. Will start doxycycline in addition to nasal steriods, antihistamines/decongestant combination, saline nasal irrigation, increase fluids, tylenol/motrin prn pain. Advised follow-up with ENT if he does not get better with this, as he has had symptoms for 6 weeks. Discussed medical decision-making, plan for follow-up with patient. He agrees with plan.  *This clinic note was created using Dragon dictation software. Therefore, there may be occasional mistakes despite careful proofreading.  ?    Melynda Ripple, MD 03/15/16 1327

## 2016-03-15 NOTE — ED Notes (Signed)
Patient c/o sinus pain and pressure and HAs for the past month.  Patient denies fevers.

## 2016-06-21 ENCOUNTER — Encounter: Payer: Self-pay | Admitting: Family Medicine

## 2016-06-21 ENCOUNTER — Ambulatory Visit (INDEPENDENT_AMBULATORY_CARE_PROVIDER_SITE_OTHER): Payer: 59 | Admitting: Family Medicine

## 2016-06-21 VITALS — BP 122/84 | HR 95 | Temp 97.7°F | Resp 16 | Wt 292.0 lb

## 2016-06-21 DIAGNOSIS — G43009 Migraine without aura, not intractable, without status migrainosus: Secondary | ICD-10-CM

## 2016-06-21 DIAGNOSIS — E785 Hyperlipidemia, unspecified: Secondary | ICD-10-CM | POA: Diagnosis not present

## 2016-06-21 DIAGNOSIS — E669 Obesity, unspecified: Secondary | ICD-10-CM

## 2016-06-21 DIAGNOSIS — L409 Psoriasis, unspecified: Secondary | ICD-10-CM

## 2016-06-21 DIAGNOSIS — R7301 Impaired fasting glucose: Secondary | ICD-10-CM | POA: Diagnosis not present

## 2016-06-21 LAB — COMPLETE METABOLIC PANEL WITH GFR
ALBUMIN: 4.1 g/dL (ref 3.6–5.1)
ALT: 43 U/L (ref 9–46)
AST: 31 U/L (ref 10–40)
Alkaline Phosphatase: 49 U/L (ref 40–115)
BUN: 11 mg/dL (ref 7–25)
CALCIUM: 9.2 mg/dL (ref 8.6–10.3)
CHLORIDE: 103 mmol/L (ref 98–110)
CO2: 25 mmol/L (ref 20–31)
CREATININE: 0.95 mg/dL (ref 0.60–1.35)
GFR, Est African American: >89 mL/min (ref >=60)
GFR, Est Non African American: >89 mL/min (ref >=60)
Glucose, Bld: 115 mg/dL — ABNORMAL HIGH (ref 65–99)
Potassium: 4.2 mmol/L (ref 3.5–5.3)
SODIUM: 139 mmol/L (ref 135–146)
Total Bilirubin: 0.7 mg/dL (ref 0.2–1.2)
Total Protein: 7.2 g/dL (ref 6.1–8.1)

## 2016-06-21 LAB — LIPID PANEL
Cholesterol: 168 mg/dL (ref 125–200)
HDL: 36 mg/dL — AB (ref >=40)
LDL Cholesterol: 115 mg/dL (ref <130)
TRIGLYCERIDES: 86 mg/dL (ref <150)
Total CHOL/HDL Ratio: 4.7 Ratio (ref <=5.0)
VLDL: 17 mg/dL (ref <30)

## 2016-06-21 MED ORDER — TRIAMCINOLONE ACETONIDE 0.5 % EX CREA: 1.0000 "application " | TOPICAL_CREAM | Freq: Three times a day (TID) | CUTANEOUS | Status: DC

## 2016-06-21 NOTE — Progress Notes (Signed)
BP 122/84 mmHg  Pulse 95  Temp(Src) 97.7 F (36.5 C) (Oral)  Resp 16  Wt 292 lb (132.45 kg)  SpO2 94%   Subjective:    Patient ID: Ethan Perez, male    DOB: Feb 12, 1971, 45 y.o.   MRN: [id number]  HPI: Ethan Perez is a 45 y.o. male  Chief Complaint  Patient presents with  . Follow-up    6 months  . Medication Refill   Patient here for follow-up Not using SABA often, tries to use to get occasional use Had a bad cold, uses it more often; not using more than 2x a week period  Headaches; uses excedrin migraine; uses once a week; does not desire testing or neuro referral  Allergies; not a particularly bad year for patient; taking zyrtec and that helps  Blood presure controlled today; no added salt at the table; not much salt when cooking; not much fast food; some processed meats, not every day  Lab Results  Component Value Date   CHOL 196 12/21/2015   CHOL 195 06/22/2015   Lab Results  Component Value Date   HDL 39* 12/21/2015   HDL 34* 06/22/2015   Lab Results  Component Value Date   LDLCALC 121* 12/21/2015   LDLCALC 128* 06/22/2015   Lab Results  Component Value Date   TRIG 180* 12/21/2015   TRIG 167* 06/22/2015   No results found for: CHOLHDL No results found for: LDLDIRECT Does eat eggs; 14 eggs a week; not interested in decreasing; skim milk; lots of cheese  Weight; has gone up; working a lot of hours; not getting enough sleep; no drowsy when driving   Depression screen PHQ 2/9 06/21/2016  Decreased Interest 0  Down, Depressed, Hopeless 0  PHQ - 2 Score 0   Relevant past medical, surgical, family and social history reviewed Past Medical History  Diagnosis Date  . Migraines   . IFG (impaired fasting glucose)   . Obesity   . Hyperlipidemia   . History of concussion   . Numbness and tingling of left thumb    Past Surgical History  Procedure Laterality Date  . Tonsillectomy  1976  . Nasal septum surgery  2001  . Laminectomy  Feb  2016  . Knee surgery Left 2007    Orthoscopic   Family History  Problem Relation Age of Onset  . Cirrhosis Mother     on diaylsis  . Seizures Mother     s/p hit her head  . Diabetes Mother   . Hypertension Mother   . Kidney disease Mother   . Migraines Father     with aura  . Diabetes Brother   . Diabetes Maternal Grandmother   . Heart disease Maternal Grandmother   . Diabetes Maternal Grandfather   . Heart disease Maternal Grandfather   . Stroke Maternal Grandfather   . Heart disease Paternal Grandmother   . Heart disease Paternal Grandfather   . Lung disease Paternal Grandfather     worked in a Theme park manager  . Alcohol abuse Paternal Grandfather   . Cancer Paternal Aunt    Social History  Substance Use Topics  . Smoking status: Never Smoker   . Smokeless tobacco: Never Used  . Alcohol Use: No   Interim medical history since last visit reviewed. Allergies and medications reviewed  Review of Systems Per HPI unless specifically indicated above     Objective:    BP 122/84 mmHg  Pulse 95  Temp(Src) 97.7 F (36.5  C) (Oral)  Resp 16  Wt 292 lb (132.45 kg)  SpO2 94%  Wt Readings from Last 3 Encounters:  06/21/16 292 lb (132.45 kg)  03/15/16 285 lb (129.275 kg)  02/29/16 283 lb (128.368 kg)   body mass index is 35.56 kg/(m^2).'  Physical Exam  Constitutional: He appears well-developed and well-nourished. No distress.  Obese, weight gain  HENT:  Head: Normocephalic and atraumatic.  Eyes: EOM are normal. No scleral icterus.  Neck: No thyromegaly present.  Cardiovascular: Normal rate and regular rhythm.   Pulmonary/Chest: Effort normal and breath sounds normal.  Abdominal: Soft. He exhibits no distension.  Musculoskeletal: He exhibits no edema.  Neurological: Coordination normal.  Skin: Skin is warm and dry. Rash (extensor surfaces of elbows, white scale) noted. No pallor.  Psychiatric: He has a normal mood and affect. His behavior is normal. Judgment and  thought content normal.    Results for orders placed or performed in visit on 12/21/15  Lipid Panel w/o Chol/HDL Ratio  Result Value Ref Range   Cholesterol, Total 196 100 - 199 mg/dL   Triglycerides 180 (H) 0 - 149 mg/dL   HDL 39 (L) >39 mg/dL   VLDL Cholesterol Cal 36 5 - 40 mg/dL   LDL Calculated 121 (H) 0 - 99 mg/dL  Comprehensive metabolic panel  Result Value Ref Range   Glucose 99 65 - 99 mg/dL   BUN 10 6 - 24 mg/dL   Creatinine, Ser 0.95 0.76 - 1.27 mg/dL   GFR calc non Af Amer 97 >59 mL/min/1.73   GFR calc Af Amer 112 >59 mL/min/1.73   BUN/Creatinine Ratio 11 9 - 20   Sodium 139 134 - 144 mmol/L   Potassium 4.6 3.5 - 5.2 mmol/L   Chloride 101 96 - 106 mmol/L   CO2 23 18 - 29 mmol/L   Calcium 9.5 8.7 - 10.2 mg/dL   Total Protein 7.3 6.0 - 8.5 g/dL   Albumin 4.3 3.5 - 5.5 g/dL   Globulin, Total 3.0 1.5 - 4.5 g/dL   Albumin/Globulin Ratio 1.4 1.1 - 2.5   Bilirubin Total 0.6 0.0 - 1.2 mg/dL   Alkaline Phosphatase 74 39 - 117 IU/L   AST 26 0 - 40 IU/L   ALT 37 0 - 44 IU/L  Hgb A1c w/o eAG  Result Value Ref Range   Hgb A1c MFr Bld 5.7 (H) 4.8 - 5.6 %      Assessment & Plan:   Problem List Items Addressed This Visit      Cardiovascular and Mediastinum   Migraines    Does not desire neuro referral; avoid any possible triggers        Endocrine   IFG (impaired fasting glucose)    Check A1c      Relevant Orders   Hemoglobin A1c   COMPLETE METABOLIC PANEL WITH GFR     Musculoskeletal and Integument   Psoriasis    Refill of cream given        Other   Obesity    Work on weight loss      Relevant Orders   COMPLETE METABOLIC PANEL WITH GFR   Hyperlipidemia - Primary    Check lipids; work on weight loss, diet counseling given      Relevant Orders   Lipid panel      Follow up plan: No Follow-up on file.  An after-visit summary was printed and given to the patient at [place].  Please see the patient instructions which may contain other  information and recommendations beyond what is mentioned above in the assessment and plan.  Meds ordered this encounter  Medications  . triamcinolone cream (KENALOG) 0.5 %    Sig: Apply 1 application topically 3 (three) times daily. If needed on rash; too strong for face    Dispense:  30 g    Refill:  2    Orders Placed This Encounter  Procedures  . Lipid panel  . Hemoglobin A1c  . COMPLETE METABOLIC PANEL WITH GFR

## 2016-06-21 NOTE — Assessment & Plan Note (Signed)
Check A1c. 

## 2016-06-21 NOTE — Assessment & Plan Note (Signed)
Check lipids; work on weight loss, diet counseling given

## 2016-06-21 NOTE — Assessment & Plan Note (Signed)
Work on weight loss.

## 2016-06-21 NOTE — Assessment & Plan Note (Signed)
Does not desire neuro referral; avoid any possible triggers

## 2016-06-21 NOTE — Patient Instructions (Addendum)
Try to limit saturated fats in your diet (bologna, hot dogs, barbeque, cheeseburgers, hamburgers, steak, bacon, sausage, cheese, etc.) and get more fresh fruits, vegetables, and whole grains  Your goal blood pressure is less than 140 mmHg on top. Try to follow the DASH guidelines (DASH stands for Dietary Approaches to Stop Hypertension) Try to limit the sodium in your diet.  Ideally, consume less than 1.5 grams (less than 1,500mg ) per day. Do not add salt when cooking or at the table.  Check the sodium amount on labels when shopping, and choose items lower in sodium when given a choice. Avoid or limit foods that already contain a lot of sodium. Eat a diet rich in fruits and vegetables and whole grains.  Check out the information at familydoctor.org entitled "Nutrition for Weight Loss: What You Need to Know about Fad Diets" Try to lose between 1-2 pounds per week by taking in fewer calories and burning off more calories You can succeed by limiting portions, limiting foods dense in calories and fat, becoming more active, and drinking 8 glasses of water a day (64 ounces) Don't skip meals, especially breakfast, as skipping meals may alter your metabolism Do not use over-the-counter weight loss pills or gimmicks that claim rapid weight loss A healthy BMI (or body mass index) is between 18.5 and 24.9 You can calculate your ideal BMI at the [place] website [employer]fr

## 2016-06-21 NOTE — Assessment & Plan Note (Signed)
Refill of cream given

## 2016-06-22 LAB — HEMOGLOBIN A1C
Hgb A1c MFr Bld: 6.1 % — ABNORMAL HIGH (ref <5.7)
Mean Plasma Glucose: 128 mg/dL

## 2016-12-21 ENCOUNTER — Ambulatory Visit (INDEPENDENT_AMBULATORY_CARE_PROVIDER_SITE_OTHER): Payer: 59 | Admitting: Family Medicine

## 2016-12-21 ENCOUNTER — Encounter: Payer: Self-pay | Admitting: Family Medicine

## 2016-12-21 VITALS — BP 134/92 | HR 93 | Temp 98.0°F | Resp 16 | Wt 309.2 lb

## 2016-12-21 DIAGNOSIS — R7301 Impaired fasting glucose: Secondary | ICD-10-CM | POA: Diagnosis not present

## 2016-12-21 DIAGNOSIS — E782 Mixed hyperlipidemia: Secondary | ICD-10-CM | POA: Diagnosis not present

## 2016-12-21 DIAGNOSIS — Z6837 Body mass index (BMI) 37.0-37.9, adult: Secondary | ICD-10-CM

## 2016-12-21 DIAGNOSIS — E6609 Other obesity due to excess calories: Secondary | ICD-10-CM

## 2016-12-21 DIAGNOSIS — R030 Elevated blood-pressure reading, without diagnosis of hypertension: Secondary | ICD-10-CM

## 2016-12-21 LAB — LIPID PANEL
CHOL/HDL RATIO: 6.5 ratio — AB (ref <5.0)
Cholesterol: 182 mg/dL (ref <200)
HDL: 28 mg/dL — AB (ref >40)
LDL CALC: 101 mg/dL — AB (ref <100)
Triglycerides: 265 mg/dL — ABNORMAL HIGH (ref <150)
VLDL: 53 mg/dL — AB (ref <30)

## 2016-12-21 LAB — COMPLETE METABOLIC PANEL WITH GFR
ALK PHOS: 54 U/L (ref 40–115)
ALT: 44 U/L (ref 9–46)
AST: 34 U/L (ref 10–40)
Albumin: 4 g/dL (ref 3.6–5.1)
BUN: 12 mg/dL (ref 7–25)
CALCIUM: 9.2 mg/dL (ref 8.6–10.3)
CHLORIDE: 103 mmol/L (ref 98–110)
CO2: 26 mmol/L (ref 20–31)
Creat: 0.89 mg/dL (ref 0.60–1.35)
Glucose, Bld: 116 mg/dL — ABNORMAL HIGH (ref 65–99)
POTASSIUM: 4 mmol/L (ref 3.5–5.3)
Sodium: 138 mmol/L (ref 135–146)
Total Bilirubin: 0.7 mg/dL (ref 0.2–1.2)
Total Protein: 7.2 g/dL (ref 6.1–8.1)

## 2016-12-21 NOTE — Assessment & Plan Note (Signed)
Check lipids today (fasting); limit eggs to no more than 3 per week; limit saturated fats

## 2016-12-21 NOTE — Progress Notes (Signed)
BP (!) 134/92 (BP Location: Left Arm, Patient Position: Sitting, Cuff Size: Normal)   Pulse 93   Temp 98 F (36.7 C) (Oral)   Resp 16   Wt (!) 309 lb 4 oz (140.3 kg)   SpO2 93%   BMI 37.64 kg/m    Subjective:    Patient ID: Ethan Perez, male    DOB: December 10, 1971, 45 y.o.   MRN: [id number]  HPI: Ethan Perez is a 45 y.o. male  Chief Complaint  Patient presents with  . Follow-up    6 month    Patient is here for 6 month follow-up of obesity, prediabetes, and high cholesterol He has come fasting for labs No medical excitement since last visit  Blood is up up today; not taking medicine; normally 130/80; not adding salt to food; no fast foods; no black licorice; no decongestants  Prediabetes; came fasting today; mother's whole family has diabetes; mother used to use insulin; trying to avoid white bread; drinks sugary drinks, but limited amount Last A1c was 6.1  High cholesterol; meat eater; grilling and skinless chicken breasts and limits fat Last LDL 115, last HDL 36  Obesity; weight gain; working so much is his reason; good weight he thinks is about 250; lowest adult weight was 219 and that was with very little muscle mass; not sluggish or tired or constipated  Depression screen [facility] 2/9 12/21/2016 06/21/2016  Decreased Interest 0 0  Down, Depressed, Hopeless 0 0  PHQ - 2 Score 0 0    Relevant past medical, surgical, family and social history reviewed Past Medical History:  Diagnosis Date  . History of concussion   . Hyperlipidemia   . IFG (impaired fasting glucose)   . Migraines   . Numbness and tingling of left thumb   . Obesity    Past Surgical History:  Procedure Laterality Date  . KNEE SURGERY Left 2007   Orthoscopic  . LAMINECTOMY  Feb 2016  . NASAL SEPTUM SURGERY  2001  . TONSILLECTOMY  1976   Family History  Problem Relation Age of Onset  . Cirrhosis Mother     on diaylsis  . Seizures Mother     s/p hit her head  . Diabetes Mother     . Hypertension Mother   . Kidney disease Mother   . Migraines Father     with aura  . Diabetes Brother   . Diabetes Maternal Grandmother   . Heart disease Maternal Grandmother   . Diabetes Maternal Grandfather   . Heart disease Maternal Grandfather   . Stroke Maternal Grandfather   . Heart disease Paternal Grandmother   . Heart disease Paternal Grandfather   . Lung disease Paternal Grandfather     worked in a Theme park manager  . Alcohol abuse Paternal Grandfather   . Cancer Paternal Aunt    Social History  Substance Use Topics  . Smoking status: Never Smoker  . Smokeless tobacco: Never Used  . Alcohol use No    Interim medical history since last visit reviewed. Allergies and medications reviewed  Review of Systems Per HPI unless specifically indicated above     Objective:    BP (!) 134/92 (BP Location: Left Arm, Patient Position: Sitting, Cuff Size: Normal)   Pulse 93   Temp 98 F (36.7 C) (Oral)   Resp 16   Wt (!) 309 lb 4 oz (140.3 kg)   SpO2 93%   BMI 37.64 kg/m   Wt Readings from Last 3 Encounters:  12/21/16 (!) 309 lb 4 oz (140.3 kg)  06/21/16 292 lb (132.5 kg)  03/15/16 285 lb (129.3 kg)    Physical Exam  Constitutional: He appears well-developed and well-nourished. No distress.  Obese, weight gain  HENT:  Head: Normocephalic and atraumatic.  Eyes: EOM are normal. No scleral icterus.  Neck: No thyromegaly present.  Cardiovascular: Normal rate and regular rhythm.   Pulmonary/Chest: Effort normal and breath sounds normal.  Abdominal: Soft. He exhibits no distension.  Musculoskeletal: He exhibits no edema.  Neurological: Coordination normal.  Skin: Skin is warm and dry. He is not diaphoretic. No pallor.  Psychiatric: He has a normal mood and affect. His behavior is normal. Judgment and thought content normal. His mood appears not anxious. He does not exhibit a depressed mood.      Assessment & Plan:   Problem List Items Addressed This Visit       Endocrine   IFG (impaired fasting glucose)    Check glucose (fasting) and A1c      Relevant Orders   Hemoglobin A1c (Completed)     Other   Obesity - Primary    Encouragement given; patient politely declined nutritionist referral and all he has to do is call if he changes his mind      Relevant Orders   COMPLETE METABOLIC PANEL WITH GFR (Completed)   Hyperlipidemia    Check lipids today (fasting); limit eggs to no more than 3 per week; limit saturated fats      Relevant Orders   Lipid panel (Completed)   Elevated BP without diagnosis of hypertension    Work on weight loss, healthier eating         Follow up plan: Return in about 6 months (around 06/21/2017). 6 months  An after-visit summary was printed and given to the patient at [place].  Please see the patient instructions which may contain other information and recommendations beyond what is mentioned above in the assessment and plan.  No orders of the defined types were placed in this encounter.   Orders Placed This Encounter  Procedures  . Lipid panel  . Hemoglobin A1c  . COMPLETE METABOLIC PANEL WITH GFR

## 2016-12-21 NOTE — Assessment & Plan Note (Signed)
Encouragement given; patient politely declined nutritionist referral and all he has to do is call if he changes his mind

## 2016-12-21 NOTE — Patient Instructions (Addendum)
Your goal blood pressure is less than 140 mmHg on top and less than 90 on the bottom Try to follow the DASH guidelines (DASH stands for Dietary Approaches to Stop Hypertension) Try to limit the sodium in your diet.  Ideally, consume less than 1.5 grams (less than 1,500mg ) per day. Do not add salt when cooking or at the table.  Check the sodium amount on labels when shopping, and choose items lower in sodium when given a choice. Avoid or limit foods that already contain a lot of sodium. Eat a diet rich in fruits and vegetables and whole grains. Check out the information at familydoctor.org entitled "Nutrition for Weight Loss: What You Need to Know about Fad Diets" Try to lose between 1-2 pounds per week by taking in fewer calories and burning off more calories You can succeed by limiting portions, limiting foods dense in calories and fat, becoming more active, and drinking 8 glasses of water a day (64 ounces) Don't skip meals, especially breakfast, as skipping meals may alter your metabolism Do not use over-the-counter weight loss pills or gimmicks that claim rapid weight loss A healthy BMI (or body mass index) is between 18.5 and 24.9 You can calculate your ideal BMI at the [place] website ClubMonetize.fr   DASH Eating Plan DASH stands for "Dietary Approaches to Stop Hypertension." The DASH eating plan is a healthy eating plan that has been shown to reduce high blood pressure (hypertension). Additional health benefits may include reducing the risk of type 2 diabetes mellitus, heart disease, and stroke. The DASH eating plan may also help with weight loss. What do I need to know about the DASH eating plan? For the DASH eating plan, you will follow these general guidelines:  Choose foods with less than 150 milligrams of sodium per serving (as listed on the food label).  Use salt-free seasonings or herbs instead of table salt or sea salt.  Check with  your health care provider or pharmacist before using salt substitutes.  Eat lower-sodium products. These are often labeled as "low-sodium" or "no salt added."  Eat fresh foods. Avoid eating a lot of canned foods.  Eat more vegetables, fruits, and low-fat dairy products.  Choose whole grains. Look for the word "whole" as the first word in the ingredient list.  Choose fish and skinless chicken or [place] more often than red meat. Limit fish, poultry, and meat to 6 oz (170 g) each day.  Limit sweets, desserts, sugars, and sugary drinks.  Choose heart-healthy fats.  Eat more home-cooked food and less restaurant, buffet, and fast food.  Limit fried foods.  Do not fry foods. Cook foods using methods such as baking, boiling, grilling, and broiling instead.  When eating at a restaurant, ask that your food be prepared with less salt, or no salt if possible. What foods can I eat? Seek help from a dietitian for individual calorie needs. Grains  Whole grain or whole wheat bread. Brown rice. Whole grain or whole wheat pasta. Quinoa, bulgur, and whole grain cereals. Low-sodium cereals. Corn or whole wheat flour tortillas. Whole grain cornbread. Whole grain crackers. Low-sodium crackers. Vegetables  Fresh or frozen vegetables (raw, steamed, roasted, or grilled). Low-sodium or reduced-sodium tomato and vegetable juices. Low-sodium or reduced-sodium tomato sauce and paste. Low-sodium or reduced-sodium canned vegetables. Fruits  All fresh, canned (in natural juice), or frozen fruits. Meat and Other Protein Products  Ground beef (85% or leaner), grass-fed beef, or beef trimmed of fat. Skinless chicken or [place]. Ground chicken or  [place]. Pork trimmed of fat. All fish and seafood. Eggs. Dried beans, peas, or lentils. Unsalted nuts and seeds. Unsalted canned beans. Dairy  Low-fat dairy products, such as skim or 1% milk, 2% or reduced-fat cheeses, low-fat ricotta or cottage cheese, or plain low-fat  yogurt. Low-sodium or reduced-sodium cheeses. Fats and Oils  Tub margarines without trans fats. Light or reduced-fat mayonnaise and salad dressings (reduced sodium). Avocado. Safflower, olive, or canola oils. Natural peanut or almond butter. Other  Unsalted popcorn and pretzels. The items listed above may not be a complete list of recommended foods or beverages. Contact your dietitian for more options.  What foods are not recommended? Grains  White bread. White pasta. White rice. Refined cornbread. Bagels and croissants. Crackers that contain trans fat. Vegetables  Creamed or fried vegetables. Vegetables in a cheese sauce. Regular canned vegetables. Regular canned tomato sauce and paste. Regular tomato and vegetable juices. Fruits  Canned fruit in light or heavy syrup. Fruit juice. Meat and Other Protein Products  Fatty cuts of meat. Ribs, chicken wings, bacon, sausage, bologna, salami, chitterlings, fatback, hot dogs, bratwurst, and packaged luncheon meats. Salted nuts and seeds. Canned beans with salt. Dairy  Whole or 2% milk, cream, half-and-half, and cream cheese. Whole-fat or sweetened yogurt. Full-fat cheeses or blue cheese. Nondairy creamers and whipped toppings. Processed cheese, cheese spreads, or cheese curds. Condiments  Onion and garlic salt, seasoned salt, table salt, and sea salt. Canned and packaged gravies. Worcestershire sauce. Tartar sauce. Barbecue sauce. Teriyaki sauce. Soy sauce, including reduced sodium. Steak sauce. Fish sauce. Oyster sauce. Cocktail sauce. Horseradish. Ketchup and mustard. Meat flavorings and tenderizers. Bouillon cubes. Hot sauce. Tabasco sauce. Marinades. Taco seasonings. Relishes. Fats and Oils  Butter, stick margarine, lard, shortening, ghee, and bacon fat. Coconut, palm kernel, or palm oils. Regular salad dressings. Other  Pickles and olives. Salted popcorn and pretzels. The items listed above may not be a complete list of foods and beverages to  avoid. Contact your dietitian for more information.  Where can I find more information? National Heart, Lung, and Blood Institute: travelstabloid.com This information is not intended to replace advice given to you by your health care provider. Make sure you discuss any questions you have with your health care provider. Document Released: 11/29/2011 Document Revised: 05/17/2016 Document Reviewed: 10/14/2013 Elsevier Interactive Patient Education  2017 [employer].

## 2016-12-21 NOTE — Assessment & Plan Note (Signed)
Check glucose (fasting) and A1c 

## 2016-12-22 LAB — HEMOGLOBIN A1C
HEMOGLOBIN A1C: 6 % — AB (ref <5.7)
Mean Plasma Glucose: 126 mg/dL

## 2016-12-24 DIAGNOSIS — R030 Elevated blood-pressure reading, without diagnosis of hypertension: Secondary | ICD-10-CM | POA: Insufficient documentation

## 2016-12-24 NOTE — Assessment & Plan Note (Signed)
Work on [employer] loss, healthier eating

## 2017-06-04 ENCOUNTER — Ambulatory Visit: Payer: Self-pay | Admitting: Family Medicine

## 2018-05-06 ENCOUNTER — Ambulatory Visit (INDEPENDENT_AMBULATORY_CARE_PROVIDER_SITE_OTHER): Payer: 59 | Admitting: Family Medicine

## 2018-05-06 ENCOUNTER — Encounter: Payer: Self-pay | Admitting: Family Medicine

## 2018-05-06 VITALS — BP 118/80 | HR 94 | Temp 97.9°F | Resp 14 | Ht 76.0 in | Wt 279.3 lb

## 2018-05-06 DIAGNOSIS — E6609 Other obesity due to excess calories: Secondary | ICD-10-CM

## 2018-05-06 DIAGNOSIS — B070 Plantar wart: Secondary | ICD-10-CM

## 2018-05-06 DIAGNOSIS — Z125 Encounter for screening for malignant neoplasm of prostate: Secondary | ICD-10-CM | POA: Diagnosis not present

## 2018-05-06 DIAGNOSIS — E782 Mixed hyperlipidemia: Secondary | ICD-10-CM

## 2018-05-06 DIAGNOSIS — Z0000 Encounter for general adult medical examination without abnormal findings: Secondary | ICD-10-CM

## 2018-05-06 DIAGNOSIS — Z6834 Body mass index (BMI) 34.0-34.9, adult: Secondary | ICD-10-CM | POA: Diagnosis not present

## 2018-05-06 DIAGNOSIS — R3912 Poor urinary stream: Secondary | ICD-10-CM | POA: Diagnosis not present

## 2018-05-06 DIAGNOSIS — L409 Psoriasis, unspecified: Secondary | ICD-10-CM

## 2018-05-06 DIAGNOSIS — R7301 Impaired fasting glucose: Secondary | ICD-10-CM | POA: Diagnosis not present

## 2018-05-06 DIAGNOSIS — Z789 Other specified health status: Secondary | ICD-10-CM

## 2018-05-06 MED ORDER — TRIAMCINOLONE ACETONIDE 0.5 % EX CREA: 1.0000 "application " | TOPICAL_CREAM | Freq: Three times a day (TID) | CUTANEOUS | 2 refills | Status: DC

## 2018-05-06 NOTE — Assessment & Plan Note (Signed)
Check lipids today 

## 2018-05-06 NOTE — Assessment & Plan Note (Signed)
Working on this; weight at home was less than today's reading; praise given today

## 2018-05-06 NOTE — Assessment & Plan Note (Signed)
Check nonfasting glucose and A1c

## 2018-05-06 NOTE — Progress Notes (Signed)
Patient ID: Ethan Perez, male   DOB: 03/21/1971, 47 y.o.   MRN: [id number]   Subjective:   Ethan Perez is a 47 y.o. male here for a complete physical exam  Interim issues since last visit:  Two issues Psoriasis, has had that since teenager; has been prescribed TAC; helps pretty well; ridges on the fingernails for 34 years; elbows are not very itchy; bleediing when flake comes off; also extensor surface of the right knuckles  Right foot hurts; hurts right along the base of the 5th metatarsal; bothering him [place] stabbing; not dull or throbbing; he has not tried anything for it; was not wearing good supportive shoes; [place] zoo a [place] that [place]; getting worse; no old injuries to the foot  USPSTF grade A and B recommendations Depression:  Depression screen [facility] 2/9 05/06/2018 12/21/2016 06/21/2016  Decreased Interest 0 0 0  Down, Depressed, Hopeless 0 0 0  PHQ - 2 Score 0 0 0   Hypertension: BP Readings from Last 3 Encounters:  05/06/18 118/80  12/21/16 (!) 134/92  06/21/16 122/84   Obesity: he has lost 30 pounds, but says he has gained 10 pounds in the last 2 months; then today's weight is additional 10 pounds over what he weighed at home because of what he carries Wt Readings from Last 3 Encounters:  05/06/18 279 lb 4.8 oz (126.7 kg)  12/21/16 (!) 309 lb 4 oz (140.3 kg)  06/21/16 292 lb (132.5 kg)   BMI Readings from Last 3 Encounters:  05/06/18 34.00 kg/m  12/21/16 37.64 kg/m  06/21/16 35.54 kg/m    Immunizations: tetanus UTD; flu shot UTD, through work  Skin cancer: nothing worrisome Lung cancer:  n/a Prostate cancer: start at age 72; no fam hx; having symptoms with weaker stream; gets up 1x a night, half hour before getting up Lab Results  Component Value Date   PSA 0.4 05/06/2018   Colorectal cancer: no fam hx; no bleeding AAA: n/a Aspirin: n/a Diet: low carb diet; does eat fatty meats Exercise: doesn't  exercise; probably close to 10k steps a day, 12,319 steps yesterday; does a lot of walking at work Alcohol: rare alcohol, 2005 Tobacco use: nonsmoker HIV, hep B, hep C: check Hep B STD testing and prevention (chl/gon/syphilis):declined Glucose: not fasting Glucose  Date Value Ref Range Status  07/09/2013 144 (H) 65 - 99 mg/dL Final   Glucose, Bld  Date Value Ref Range Status  05/06/2018 82 65 - 139 mg/dL Final    Comment:    .        Non-fasting reference interval .   12/21/2016 116 (H) 65 - 99 mg/dL Final  06/21/2016 115 (H) 65 - 99 mg/dL Final   Lipids: not fasting Lab Results  Component Value Date   CHOL 196 05/06/2018   CHOL 182 12/21/2016   CHOL 168 06/21/2016   Lab Results  Component Value Date   HDL 40 (L) 05/06/2018   HDL 28 (L) 12/21/2016   HDL 36 (L) 06/21/2016   Lab Results  Component Value Date   [facility]  05/06/2018     Comment:     . LDL cholesterol not calculated. Triglyceride levels greater than 400 mg/dL invalidate calculated LDL results. . Reference range: <100 . Desirable range <100 mg/dL for primary prevention;   <70 mg/dL for patients with CHD or diabetic patients  with > or = 2 CHD risk factors. Marland Kitchen LDL-C is now calculated  using the Martin-Hopkins  calculation, which is a validated novel method providing  better accuracy than the Friedewald equation in the  estimation of LDL-C.  Cresenciano Genre et al. Annamaria Helling. 3474;259(56): 2061-2068  (http://education.[facility].com/faq/FAQ164)    LDLCALC 101 (H) 12/21/2016   LDLCALC 115 06/21/2016   Lab Results  Component Value Date   TRIG 423 (H) 05/06/2018   TRIG 265 (H) 12/21/2016   TRIG 86 06/21/2016   Lab Results  Component Value Date   CHOLHDL 4.9 05/06/2018   CHOLHDL 6.5 (H) 12/21/2016   CHOLHDL 4.7 06/21/2016   No results found for: LDLDIRECT   Past Medical History:  Diagnosis Date  . History of concussion   . Hyperlipidemia   . IFG (impaired fasting glucose)   . Migraines   .  Numbness and tingling of left thumb   . Obesity    Past Surgical History:  Procedure Laterality Date  . KNEE SURGERY Left 2007   Orthoscopic  . LAMINECTOMY  Feb 2016  . NASAL SEPTUM SURGERY  2001  . TONSILLECTOMY  1976   Family History  Problem Relation Age of Onset  . Cirrhosis Mother        on diaylsis  . Seizures Mother        s/p hit her head  . Diabetes Mother   . Hypertension Mother   . Kidney disease Mother   . Migraines Father        with aura  . Heart disease Paternal Grandfather   . Lung disease Paternal Grandfather        worked in a Theme park manager  . Alcohol abuse Paternal Grandfather   . Diabetes Brother   . Diabetes Maternal Grandmother   . Heart disease Maternal Grandmother   . Diabetes Maternal Grandfather   . Heart disease Maternal Grandfather   . Stroke Maternal Grandfather   . Heart disease Paternal Grandmother   . Cancer Paternal Aunt    Social History   Tobacco Use  . Smoking status: Never Smoker  . Smokeless tobacco: Never Used  Substance Use Topics  . Alcohol use: No  . Drug use: No   Review of Systems  Constitutional: Negative for unexpected weight change.  HENT: Negative for hearing loss.   Eyes: Negative for visual disturbance.  Respiratory: Negative for shortness of breath.   Cardiovascular: Negative for leg swelling.  Gastrointestinal: Negative for blood in stool.  Genitourinary: Negative for hematuria.       Change in urine stream  Musculoskeletal:       No aches  Skin:       No worrisome moles  Allergic/Immunologic: Positive for food allergies ([place] nuts; pt denies needing epipen).  Neurological: Negative for tremors.  Hematological: Does not bruise/bleed easily.    Objective:   Vitals:   05/06/18 1447  BP: 118/80  Pulse: 94  Resp: 14  Temp: 97.9 F (36.6 C)  TempSrc: Oral  SpO2: 95%  Weight: 279 lb 4.8 oz (126.7 kg)  Height: 6\' 4"  (1.93 m)   Body mass index is 34 kg/m. Wt Readings from Last 3 Encounters:   05/06/18 279 lb 4.8 oz (126.7 kg)  12/21/16 (!) 309 lb 4 oz (140.3 kg)  06/21/16 292 lb (132.5 kg)   Physical Exam  Constitutional: He appears well-developed and well-nourished. No distress.  HENT:  Head: Normocephalic and atraumatic.  Nose: Nose normal.  Mouth/Throat: Oropharynx is clear and moist.  Eyes: EOM are normal. No scleral icterus.  Neck: No JVD present. No thyromegaly  present.  Cardiovascular: Normal rate, regular rhythm and normal heart sounds.  Pulmonary/Chest: Effort normal and breath sounds normal. No respiratory distress. He has no wheezes. He has no rales.  Abdominal: Soft. Bowel sounds are normal. He exhibits no distension. There is no tenderness. There is no guarding.  Musculoskeletal: Normal range of motion. He exhibits no edema.  Lymphadenopathy:    He has no cervical adenopathy.  Neurological: He is alert. He displays normal reflexes. He exhibits normal muscle tone. Coordination normal.  Skin: Skin is warm and dry. Rash (extensor surface elbows, positive scale, flakes off) noted. He is not diaphoretic. No erythema. No pallor.  Verrucous lesion sole of foot  Psychiatric: He has a normal mood and affect. His behavior is normal. Judgment and thought content normal.    Assessment/Plan:   Problem List Items Addressed This Visit      Endocrine   IFG (impaired fasting glucose)    Check nonfasting glucose and A1c      Relevant Orders   Hemoglobin A1c (Completed)     Musculoskeletal and Integument   Psoriasis (Chronic)    Continue TAC        Other   Preventative health care - Primary    USPSTF grade A and B recommendations reviewed with patient; age-appropriate recommendations, preventive care, screening tests, etc discussed and encouraged; healthy living encouraged; see AVS for patient education given to patient       Relevant Orders   CBC with Differential/Platelet (Completed)   COMPLETE METABOLIC PANEL WITH GFR (Completed)   Lipid panel (Completed)    TSH (Completed)   Obesity (Chronic)    Working on this; weight at home was less than today's reading; praise given today      Hyperlipidemia (Chronic)    Check lipids today       Other Visit Diagnoses    Plantar wart       try Duofilm   BMI 34.0-34.9,adult       encouragement given for ongoing weight loss   Weak urine stream       check urine, check PSA   Relevant Orders   PSA (Completed)   Hepatitis B vaccination status unknown       check status   Relevant Orders   Hepatitis B surface antibody (Completed)   Hepatitis B Surface AntiGEN (Completed)   Screening for prostate cancer       Relevant Orders   PSA (Completed)      Meds ordered this encounter  Medications  . triamcinolone cream (KENALOG) 0.5 %    Sig: Apply 1 application topically 3 (three) times daily. If needed on rash; too strong for face, underarms, groin    Dispense:  30 g    Refill:  2   Orders Placed This Encounter  Procedures  . PSA  . Hepatitis B surface antibody  . Hepatitis B Surface AntiGEN  . Hemoglobin A1c  . CBC with Differential/Platelet  . COMPLETE METABOLIC PANEL WITH GFR  . Lipid panel  . TSH    Follow up plan: Return in about 1 year (around 05/07/2019) for complete physical.  An After Visit Summary was printed and given to the patient.

## 2018-05-06 NOTE — Assessment & Plan Note (Signed)
Continue TAC

## 2018-05-06 NOTE — Assessment & Plan Note (Signed)
USPSTF grade A and B recommendations reviewed with patient; age-appropriate recommendations, preventive care, screening tests, etc discussed and encouraged; healthy living encouraged; see AVS for patient education given to patient  

## 2018-05-06 NOTE — Patient Instructions (Addendum)
Try Duofilm on the bottom of the foot, every day for about a month Carry benadryl with you; do not eat any mixed nuts; call 911 if trouble breathing with an alergic reaction Let's get labs today If you have not heard anything from my staff in a week about any orders/referrals/studies from today, please contact us here to follow-up [phone]  Check out the information at familydoctor.org entitled "Nutrition for Weight Loss: What You Need to Know about Fad Diets" Try to lose between 1-2 pounds per week by taking in fewer calories and burning off more calories You can succeed by limiting portions, limiting foods dense in calories and fat, becoming more active, and drinking 8 glasses of water a day (64 ounces) Don't skip meals, especially breakfast, as skipping meals may alter your metabolism Do not use over-the-counter weight loss pills or gimmicks that claim rapid weight loss A healthy BMI (or body mass index) is between 18.5 and 24.9 You can calculate your ideal BMI at the [employer] website ClubMonetize.fr Try to limit saturated fats in your diet (bologna, hot dogs, barbeque, cheeseburgers, hamburgers, steak, bacon, sausage, cheese, etc.) and get more fresh fruits, vegetables, and whole grains  Health Maintenance, Male A healthy lifestyle and preventive care is important for your health and wellness. Ask your health care provider about what schedule of regular examinations is right for you. What should I know about weight and diet? Eat a Healthy Diet  Eat plenty of vegetables, fruits, whole grains, low-fat dairy products, and lean protein.  Do not eat a lot of foods high in solid fats, added sugars, or salt.  Maintain a Healthy Weight Regular exercise can help you achieve or maintain a healthy weight. You should:  Do at least 150 minutes of exercise each week. The exercise should increase your heart rate and make you sweat  (moderate-intensity exercise).  Do strength-training exercises at least twice a week.  Watch Your Levels of Cholesterol and Blood Lipids  Have your blood tested for lipids and cholesterol every 5 years starting at 47 years of age. If you are at high risk for heart disease, you should start having your blood tested when you are 47 years old. You may need to have your cholesterol levels checked more often if: ? Your lipid or cholesterol levels are high. ? You are older than 47 years of age. ? You are at high risk for heart disease.  What should I know about cancer screening? Many types of cancers can be detected early and may often be prevented. Lung Cancer  You should be screened every year for lung cancer if: ? You are a current smoker who has smoked for at least 30 years. ? You are a former smoker who has quit within the past 15 years.  Talk to your health care provider about your screening options, when you should start screening, and how often you should be screened.  Colorectal Cancer  Routine colorectal cancer screening usually begins at 47 years of age and should be repeated every 5-10 years until you are 47 years old. You may need to be screened more often if early forms of precancerous polyps or small growths are found. Your health care provider may recommend screening at an earlier age if you have risk factors for colon cancer.  Your health care provider may recommend using home test kits to check for hidden blood in the stool.  A small camera at the end of a tube can be used to examine  your colon (sigmoidoscopy or colonoscopy). This checks for the earliest forms of colorectal cancer.  Prostate and Testicular Cancer  Depending on your age and overall health, your health care provider may do certain tests to screen for prostate and testicular cancer.  Talk to your health care provider about any symptoms or concerns you have about testicular or prostate cancer.  Skin  Cancer  Check your skin from head to toe regularly.  Tell your health care provider about any new moles or changes in moles, especially if: ? There is a change in a mole's size, shape, or color. ? You have a mole that is larger than a pencil eraser.  Always use sunscreen. Apply sunscreen liberally and repeat throughout the day.  Protect yourself by wearing long sleeves, pants, a wide-brimmed hat, and sunglasses when outside.  What should I know about heart disease, diabetes, and high blood pressure?  If you are 14-16 years of age, have your blood pressure checked every 3-5 years. If you are 40 years of age or older, have your blood pressure checked every year. You should have your blood pressure measured twice-once when you are at a hospital or clinic, and once when you are not at a hospital or clinic. Record the average of the two measurements. To check your blood pressure when you are not at a hospital or clinic, you can use: ? An automated blood pressure machine at a pharmacy. ? A home blood pressure monitor.  Talk to your health care provider about your target blood pressure.  If you are between 44-44 years old, ask your health care provider if you should take aspirin to prevent heart disease.  Have regular diabetes screenings by checking your fasting blood sugar level. ? If you are at a normal weight and have a low risk for diabetes, have this test once every three years after the age of 67. ? If you are overweight and have a high risk for diabetes, consider being tested at a younger age or more often.  A one-time screening for abdominal aortic aneurysm (AAA) by ultrasound is recommended for men aged 53-75 years who are current or former smokers. What should I know about preventing infection? Hepatitis B If you have a higher risk for hepatitis B, you should be screened for this virus. Talk with your health care provider to find out if you are at risk for hepatitis B  infection. Hepatitis C Blood testing is recommended for:  Everyone born from [birth year] through 1965.  Anyone with known risk factors for hepatitis C.  Sexually Transmitted Diseases (STDs)  You should be screened each year for STDs including gonorrhea and chlamydia if: ? You are sexually active and are younger than 47 years of age. ? You are older than 47 years of age and your health care provider tells you that you are at risk for this type of infection. ? Your sexual activity has changed since you were last screened and you are at an increased risk for chlamydia or gonorrhea. Ask your health care provider if you are at risk.  Talk with your health care provider about whether you are at high risk of being infected with HIV. Your health care provider may recommend a prescription medicine to help prevent HIV infection.  What else can I do?  Schedule regular health, dental, and eye exams.  Stay current with your vaccines (immunizations).  Do not use any tobacco products, such as cigarettes, chewing tobacco, and e-cigarettes. If you need help  quitting, ask your health care provider.  Limit alcohol intake to no more than 2 drinks per day. One drink equals 12 ounces of beer, 5 ounces of wine, or 1 ounces of hard liquor.  Do not use street drugs.  Do not share needles.  Ask your health care provider for help if you need support or information about quitting drugs.  Tell your health care provider if you often feel depressed.  Tell your health care provider if you have ever been abused or do not feel safe at home. This information is not intended to replace advice given to you by your health care provider. Make sure you discuss any questions you have with your health care provider. Document Released: 06/07/2008 Document Revised: 08/08/2016 Document Reviewed: 09/13/2015 Elsevier Interactive Patient Education  [employer].

## 2018-05-15 LAB — COMPLETE METABOLIC PANEL WITH GFR
AG Ratio: 1.4 (calc) (ref 1.0–2.5)
ALKALINE PHOSPHATASE (APISO): 46 U/L (ref 40–115)
ALT: 30 U/L (ref 9–46)
AST: 28 U/L (ref 10–40)
Albumin: 4.4 g/dL (ref 3.6–5.1)
BUN: 19 mg/dL (ref 7–25)
CO2: 29 mmol/L (ref 20–32)
CREATININE: 1 mg/dL (ref 0.60–1.35)
Calcium: 9.7 mg/dL (ref 8.6–10.3)
Chloride: 102 mmol/L (ref 98–110)
GFR, Est African American: 104 mL/min/{1.73_m2} (ref >=60)
GFR, Est Non African American: 90 mL/min/{1.73_m2} (ref >=60)
GLUCOSE: 82 mg/dL (ref 65–139)
Globulin: 3.1 g/dL (calc) (ref 1.9–3.7)
Potassium: 4.2 mmol/L (ref 3.5–5.3)
Sodium: 139 mmol/L (ref 135–146)
Total Bilirubin: 0.4 mg/dL (ref 0.2–1.2)
Total Protein: 7.5 g/dL (ref 6.1–8.1)

## 2018-05-15 LAB — HEMOGLOBIN A1C
Hgb A1c MFr Bld: 5.4 % of total Hgb (ref <5.7)
MEAN PLASMA GLUCOSE: 108 (calc)
eAG (mmol/L): 6 (calc)

## 2018-05-15 LAB — CBC WITH DIFFERENTIAL/PLATELET
BASOS ABS: 101 {cells}/uL (ref 0–200)
Basophils Relative: 1.2 %
EOS PCT: 3.7 %
Eosinophils Absolute: 311 cells/uL (ref 15–500)
HCT: 48.8 % (ref 38.5–50.0)
Hemoglobin: 17.5 g/dL — ABNORMAL HIGH (ref 13.2–17.1)
Lymphs Abs: 2772 cells/uL (ref 850–3900)
MCH: 30.6 pg (ref 27.0–33.0)
MCHC: 35.9 g/dL (ref 32.0–36.0)
MCV: 85.5 fL (ref 80.0–100.0)
MONOS PCT: 8.6 %
MPV: 12.9 fL — ABNORMAL HIGH (ref 7.5–12.5)
NEUTROS ABS: 4494 {cells}/uL (ref 1500–7800)
NEUTROS PCT: 53.5 %
Platelets: 215 10*3/uL (ref 140–400)
RBC: 5.71 10*6/uL (ref 4.20–5.80)
RDW: 12.6 % (ref 11.0–15.0)
Total Lymphocyte: 33 %
WBC mixed population: 722 cells/uL (ref 200–950)
WBC: 8.4 10*3/uL (ref 3.8–10.8)

## 2018-05-15 LAB — TSH: TSH: 1.86 mIU/L (ref 0.40–4.50)

## 2018-05-15 LAB — LIPID PANEL
CHOL/HDL RATIO: 4.9 (calc) (ref <5.0)
CHOLESTEROL: 196 mg/dL (ref <200)
HDL: 40 mg/dL — AB (ref >40)
Non-HDL Cholesterol (Calc): 156 mg/dL (calc) — ABNORMAL HIGH (ref <130)
Triglycerides: 423 mg/dL — ABNORMAL HIGH (ref <150)

## 2018-05-15 LAB — PSA: PSA: 0.4 ng/mL (ref <=4.0)

## 2018-05-15 LAB — HEPATITIS B SURFACE ANTIBODY,QUALITATIVE: Hep B S Ab: REACTIVE — AB

## 2018-05-15 LAB — HEPATITIS B SURFACE ANTIGEN: Hepatitis B Surface Ag: NONREACTIVE

## 2018-06-22 DIAGNOSIS — J3089 Other allergic rhinitis: Secondary | ICD-10-CM | POA: Diagnosis not present

## 2018-06-22 DIAGNOSIS — J018 Other acute sinusitis: Secondary | ICD-10-CM | POA: Diagnosis not present

## 2018-06-29 DIAGNOSIS — J019 Acute sinusitis, unspecified: Secondary | ICD-10-CM | POA: Diagnosis not present

## 2018-07-13 DIAGNOSIS — J0181 Other acute recurrent sinusitis: Secondary | ICD-10-CM | POA: Diagnosis not present

## 2018-07-17 DIAGNOSIS — Z760 Encounter for issue of repeat prescription: Secondary | ICD-10-CM | POA: Diagnosis not present

## 2018-09-05 ENCOUNTER — Other Ambulatory Visit: Payer: Self-pay | Admitting: Otolaryngology

## 2018-09-05 DIAGNOSIS — J330 Polyp of nasal cavity: Secondary | ICD-10-CM | POA: Diagnosis not present

## 2018-09-05 DIAGNOSIS — H6062 Unspecified chronic otitis externa, left ear: Secondary | ICD-10-CM | POA: Diagnosis not present

## 2018-09-05 DIAGNOSIS — J301 Allergic rhinitis due to pollen: Secondary | ICD-10-CM | POA: Diagnosis not present

## 2018-09-10 ENCOUNTER — Ambulatory Visit: Payer: 59

## 2018-09-19 ENCOUNTER — Ambulatory Visit
Admission: RE | Admit: 2018-09-19 | Discharge: 2018-09-19 | Disposition: A | Discharge status: Home / Self Care | Payer: 59 | Source: Ambulatory Visit | Attending: Otolaryngology | Admitting: Otolaryngology

## 2018-09-19 ENCOUNTER — Other Ambulatory Visit
Admission: RE | Admit: 2018-09-19 | Discharge: 2018-09-19 | Disposition: A | Discharge status: Home / Self Care | Payer: 59 | Source: Ambulatory Visit | Attending: Otolaryngology | Admitting: Otolaryngology

## 2018-09-19 DIAGNOSIS — J329 Chronic sinusitis, unspecified: Secondary | ICD-10-CM | POA: Insufficient documentation

## 2018-09-19 DIAGNOSIS — J330 Polyp of nasal cavity: Secondary | ICD-10-CM | POA: Insufficient documentation

## 2018-09-19 DIAGNOSIS — J3489 Other specified disorders of nose and nasal sinuses: Secondary | ICD-10-CM | POA: Diagnosis not present

## 2018-09-19 HISTORY — DX: Unspecified asthma, uncomplicated: J45.909

## 2018-09-19 LAB — CREATININE, SERUM
Creatinine, Ser: 0.88 mg/dL (ref 0.61–1.24)
GFR calc Af Amer: >60 mL/min (ref >60)
GFR calc non Af Amer: >60 mL/min (ref >60)

## 2018-09-19 MED ORDER — IOHEXOL 300 MG/ML  SOLN
75.0000 mL | Freq: Once | INTRAMUSCULAR | Status: AC | PRN
  Administered 2018-09-19: 75 mL via INTRAVENOUS

## 2018-10-01 DIAGNOSIS — J3489 Other specified disorders of nose and nasal sinuses: Secondary | ICD-10-CM | POA: Diagnosis not present

## 2018-10-01 DIAGNOSIS — R220 Localized swelling, mass and lump, head: Secondary | ICD-10-CM | POA: Diagnosis not present

## 2018-10-01 DIAGNOSIS — M898X8 Other specified disorders of bone, other site: Secondary | ICD-10-CM | POA: Diagnosis not present

## 2018-10-01 DIAGNOSIS — J310 Chronic rhinitis: Secondary | ICD-10-CM | POA: Diagnosis not present

## 2018-10-07 DIAGNOSIS — Z31441 Encounter for testing of male partner of patient with recurrent pregnancy loss: Secondary | ICD-10-CM | POA: Diagnosis not present

## 2018-10-14 DIAGNOSIS — D385 Neoplasm of uncertain behavior of other respiratory organs: Secondary | ICD-10-CM | POA: Diagnosis not present

## 2018-10-14 DIAGNOSIS — J310 Chronic rhinitis: Secondary | ICD-10-CM | POA: Diagnosis not present

## 2018-10-14 DIAGNOSIS — M899 Disorder of bone, unspecified: Secondary | ICD-10-CM | POA: Diagnosis not present

## 2018-10-14 DIAGNOSIS — J309 Allergic rhinitis, unspecified: Secondary | ICD-10-CM | POA: Diagnosis not present

## 2018-10-14 DIAGNOSIS — J328 Other chronic sinusitis: Secondary | ICD-10-CM | POA: Diagnosis not present

## 2018-10-14 DIAGNOSIS — C319 Malignant neoplasm of accessory sinus, unspecified: Secondary | ICD-10-CM | POA: Diagnosis not present

## 2018-10-14 DIAGNOSIS — J342 Deviated nasal septum: Secondary | ICD-10-CM | POA: Diagnosis not present

## 2018-10-14 DIAGNOSIS — D491 Neoplasm of unspecified behavior of respiratory system: Secondary | ICD-10-CM | POA: Diagnosis not present

## 2018-10-14 DIAGNOSIS — G960 Cerebrospinal fluid leak: Secondary | ICD-10-CM | POA: Diagnosis not present

## 2018-10-14 HISTORY — PX: OTHER SURGICAL HISTORY: SHX169

## 2018-10-23 DIAGNOSIS — J310 Chronic rhinitis: Secondary | ICD-10-CM | POA: Diagnosis not present

## 2018-10-29 DIAGNOSIS — J310 Chronic rhinitis: Secondary | ICD-10-CM | POA: Diagnosis not present

## 2018-11-26 DIAGNOSIS — J310 Chronic rhinitis: Secondary | ICD-10-CM | POA: Diagnosis not present

## 2018-11-26 DIAGNOSIS — D481 Neoplasm of uncertain behavior of connective and other soft tissue: Secondary | ICD-10-CM | POA: Diagnosis not present

## 2019-01-07 DIAGNOSIS — J310 Chronic rhinitis: Secondary | ICD-10-CM | POA: Diagnosis not present

## 2019-01-23 ENCOUNTER — Ambulatory Visit: Payer: 59 | Admitting: Family Medicine

## 2019-01-23 ENCOUNTER — Encounter: Payer: Self-pay | Admitting: Family Medicine

## 2019-01-23 VITALS — BP 126/80 | HR 99 | Temp 97.9°F | Ht 76.0 in | Wt 304.0 lb

## 2019-01-23 DIAGNOSIS — R253 Fasciculation: Secondary | ICD-10-CM | POA: Diagnosis not present

## 2019-01-23 DIAGNOSIS — D485 Neoplasm of uncertain behavior of skin: Secondary | ICD-10-CM

## 2019-01-23 DIAGNOSIS — L723 Sebaceous cyst: Secondary | ICD-10-CM

## 2019-01-23 DIAGNOSIS — R252 Cramp and spasm: Secondary | ICD-10-CM | POA: Diagnosis not present

## 2019-01-23 NOTE — Patient Instructions (Signed)
We'll have you see the dermatologist Return in May for your physical

## 2019-01-23 NOTE — Progress Notes (Signed)
BP 126/80   Pulse 99   Temp 97.9 F (36.6 C)   Ht 6\' 4"  (1.93 m)   Wt (!) 304 lb (137.9 kg)   SpO2 97%   BMI 37.00 kg/m    Subjective:    Patient ID: Ethan Perez, male    DOB: 06-19-1971, 48 y.o.   MRN: [id number]  HPI: KHALFANI WEIDEMAN is a 48 y.o. male  Chief Complaint  Patient presents with  . 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  His right hand cramps up, does all kinds of weird things; hurts; going on for a few years It happens occasionally; seems to be getting more frequent Occasional cramps in the legs Not much fruits or veggies Eats bananas occasionally Cramps in the calves; no toe curling Sees muscle fasciculations in the web between base of thumb and dorsum of hand at times Right-handed  Depression screen [facility] 2/9 01/23/2019 05/06/2018 12/21/2016 06/21/2016  Decreased Interest 0 0 0 0  Down, Depressed, Hopeless 0 0 0 0  PHQ - 2 Score 0 0 0 0  Altered sleeping 0 - - -  Tired, decreased energy 0 - - -  Change in appetite 0 - - -  Feeling bad or failure about yourself  0 - - -  Trouble concentrating 0 - - -  Moving slowly or fidgety/restless 0 - - -  Suicidal thoughts 0 - - -  PHQ-9 Score 0 - - -  Difficult doing work/chores Not difficult at all - - -   Fall Risk  01/23/2019 05/06/2018 12/21/2016 06/21/2016  Falls in the past year? 0 No No No    Relevant past medical, surgical, family and social history reviewed Past Medical History:  Diagnosis Date  . Asthma   . History of concussion   . Hyperlipidemia   . IFG (impaired  fasting glucose)   . Migraines   . Numbness and tingling of left thumb   . Obesity    Past Surgical History:  Procedure Laterality Date  . KNEE SURGERY Left 2007   Orthoscopic  . LAMINECTOMY  Feb 2016  . NASAL SEPTUM SURGERY  2001  . sinus cancer removal  10/14/2018  . TONSILLECTOMY  1976   Family History  Problem Relation Age of Onset  . Cirrhosis Mother        on diaylsis  . Seizures Mother        s/p hit her head  . Diabetes Mother   . Hypertension Mother   . Kidney disease Mother   . Migraines Father        with aura  . Heart disease Paternal Grandfather   . Lung disease Paternal Grandfather        worked in a Theme park manager  . Alcohol abuse Paternal Grandfather   . Diabetes Brother   . Diabetes Maternal Grandmother   . Heart disease Maternal Grandmother   . Diabetes Maternal Grandfather   . Heart disease Maternal Grandfather   . Stroke Maternal Grandfather   .  Heart disease Paternal Grandmother   . Cancer Paternal Aunt    Social History   Tobacco Use  . Smoking status: Never Smoker  . Smokeless tobacco: Never Used  Substance Use Topics  . Alcohol use: No  . Drug use: No     Office Visit from 01/23/2019 in [facility]  AUDIT-C Score  0      Interim medical history since last visit reviewed. Allergies and medications reviewed  Review of Systems Per HPI unless specifically indicated above     Objective:    BP 126/80   Pulse 99   Temp 97.9 F (36.6 C)   Ht 6\' 4"  (1.93 m)   Wt (!) 304 lb (137.9 kg)   SpO2 97%   BMI 37.00 kg/m   Wt Readings from Last 3 Encounters:  01/23/19 (!) 304 lb (137.9 kg)  05/06/18 279 lb 4.8 oz (126.7 kg)  12/21/16 (!) 309 lb 4 oz (140.3 kg)    Physical Exam Constitutional:      General: He is not in acute distress.    Appearance: He is well-developed. He is obese.  Eyes:     General: No scleral icterus. Cardiovascular:     Rate and Rhythm: Normal rate and regular rhythm.  Pulmonary:      Effort: Pulmonary effort is normal.     Breath sounds: Normal breath sounds.  Skin:    Coloration: Skin is not pale.     Comments: Two cystic areas on the back  Neurological:     Mental Status: He is alert.  Psychiatric:        Mood and Affect: Mood is not anxious.        Assessment & Plan:   Problem List Items Addressed This Visit    None    Visit Diagnoses    Sebaceous cyst    -  Primary   Relevant Orders   Ambulatory referral to Dermatology   Neoplasm of uncertain behavior of skin       Relevant Orders   Ambulatory referral to Dermatology   Hand cramps       Relevant Orders   Ambulatory referral to Neurology   BASIC METABOLIC PANEL WITH GFR (Completed)   Magnesium (Completed)   Muscle fasciculation       Relevant Orders   Ambulatory referral to Neurology   BASIC METABOLIC PANEL WITH GFR (Completed)   Magnesium (Completed)       Follow up plan: No follow-ups on file.  An after-visit summary was printed and given to the patient at [place].  Please see the patient instructions which may contain other information and recommendations beyond what is mentioned above in the assessment and plan.  No orders of the defined types were placed in this encounter.   Orders Placed This Encounter  Procedures  . BASIC METABOLIC PANEL WITH GFR  . Magnesium  . Ambulatory referral to Dermatology  . Ambulatory referral to Neurology

## 2019-01-24 LAB — BASIC METABOLIC PANEL WITH GFR
BUN: 9 mg/dL (ref 7–25)
CO2: 25 mmol/L (ref 20–32)
CREATININE: 0.86 mg/dL (ref 0.60–1.35)
Calcium: 9.4 mg/dL (ref 8.6–10.3)
Chloride: 103 mmol/L (ref 98–110)
GFR, EST NON AFRICAN AMERICAN: 103 mL/min/{1.73_m2} (ref >=60)
GFR, Est African American: 120 mL/min/{1.73_m2} (ref >=60)
GLUCOSE: 155 mg/dL — AB (ref 65–99)
Potassium: 4.1 mmol/L (ref 3.5–5.3)
Sodium: 138 mmol/L (ref 135–146)

## 2019-01-24 LAB — MAGNESIUM: MAGNESIUM: 1.9 mg/dL (ref 1.5–2.5)

## 2019-03-03 DIAGNOSIS — M545 Low back pain: Secondary | ICD-10-CM | POA: Diagnosis not present

## 2019-03-04 DIAGNOSIS — R252 Cramp and spasm: Secondary | ICD-10-CM | POA: Diagnosis not present

## 2019-03-04 DIAGNOSIS — R253 Fasciculation: Secondary | ICD-10-CM | POA: Diagnosis not present

## 2019-04-07 DIAGNOSIS — D225 Melanocytic nevi of trunk: Secondary | ICD-10-CM | POA: Diagnosis not present

## 2019-04-07 DIAGNOSIS — D229 Melanocytic nevi, unspecified: Secondary | ICD-10-CM | POA: Diagnosis not present

## 2019-04-07 DIAGNOSIS — D0359 Melanoma in situ of other part of trunk: Secondary | ICD-10-CM | POA: Diagnosis not present

## 2019-04-07 DIAGNOSIS — D1800 Hemangioma unspecified site: Secondary | ICD-10-CM | POA: Diagnosis not present

## 2019-04-07 DIAGNOSIS — D2239 Melanocytic nevi of other parts of face: Secondary | ICD-10-CM | POA: Diagnosis not present

## 2019-04-07 DIAGNOSIS — C439 Malignant melanoma of skin, unspecified: Secondary | ICD-10-CM

## 2019-04-07 DIAGNOSIS — D485 Neoplasm of uncertain behavior of skin: Secondary | ICD-10-CM | POA: Diagnosis not present

## 2019-04-07 DIAGNOSIS — D239 Other benign neoplasm of skin, unspecified: Secondary | ICD-10-CM

## 2019-04-07 HISTORY — DX: Malignant melanoma of skin, unspecified: C43.9

## 2019-04-07 HISTORY — DX: Other benign neoplasm of skin, unspecified: D23.9

## 2019-04-14 DIAGNOSIS — D0359 Melanoma in situ of other part of trunk: Secondary | ICD-10-CM | POA: Diagnosis not present

## 2019-04-14 DIAGNOSIS — D225 Melanocytic nevi of trunk: Secondary | ICD-10-CM | POA: Diagnosis not present

## 2019-04-14 DIAGNOSIS — D485 Neoplasm of uncertain behavior of skin: Secondary | ICD-10-CM | POA: Diagnosis not present

## 2019-04-14 DIAGNOSIS — L720 Epidermal cyst: Secondary | ICD-10-CM | POA: Diagnosis not present

## 2019-04-14 DIAGNOSIS — L988 Other specified disorders of the skin and subcutaneous tissue: Secondary | ICD-10-CM | POA: Diagnosis not present

## 2019-04-14 HISTORY — PX: MELANOMA EXCISION: SHX5266

## 2019-04-21 DIAGNOSIS — L720 Epidermal cyst: Secondary | ICD-10-CM | POA: Diagnosis not present

## 2019-09-13 ENCOUNTER — Other Ambulatory Visit: Payer: Self-pay

## 2019-09-13 ENCOUNTER — Emergency Department
Admission: EM | Admit: 2019-09-13 | Discharge: 2019-09-13 | Disposition: A | Discharge status: Home / Self Care | Payer: 59 | Attending: Emergency Medicine | Admitting: Emergency Medicine

## 2019-09-13 ENCOUNTER — Emergency Department: Payer: 59

## 2019-09-13 ENCOUNTER — Encounter: Payer: Self-pay | Admitting: Emergency Medicine

## 2019-09-13 DIAGNOSIS — Z8522 Personal history of malignant neoplasm of nasal cavities, middle ear, and accessory sinuses: Secondary | ICD-10-CM | POA: Insufficient documentation

## 2019-09-13 DIAGNOSIS — J0110 Acute frontal sinusitis, unspecified: Secondary | ICD-10-CM

## 2019-09-13 DIAGNOSIS — J45909 Unspecified asthma, uncomplicated: Secondary | ICD-10-CM | POA: Insufficient documentation

## 2019-09-13 DIAGNOSIS — R51 Headache: Secondary | ICD-10-CM | POA: Diagnosis present

## 2019-09-13 DIAGNOSIS — J321 Chronic frontal sinusitis: Secondary | ICD-10-CM | POA: Insufficient documentation

## 2019-09-13 HISTORY — DX: Malignant (primary) neoplasm, unspecified: C80.1

## 2019-09-13 LAB — BASIC METABOLIC PANEL
Anion gap: 7 (ref 5–15)
BUN: 17 mg/dL (ref 6–20)
CO2: 25 mmol/L (ref 22–32)
Calcium: 9 mg/dL (ref 8.9–10.3)
Chloride: 107 mmol/L (ref 98–111)
Creatinine, Ser: 0.89 mg/dL (ref 0.61–1.24)
GFR calc Af Amer: >60 mL/min (ref >60)
GFR calc non Af Amer: >60 mL/min (ref >60)
Glucose, Bld: 117 mg/dL — ABNORMAL HIGH (ref 70–99)
Potassium: 3.8 mmol/L (ref 3.5–5.1)
Sodium: 139 mmol/L (ref 135–145)

## 2019-09-13 LAB — CBC
HCT: 44.9 % (ref 39.0–52.0)
Hemoglobin: 15.6 g/dL (ref 13.0–17.0)
MCH: 29.5 pg (ref 26.0–34.0)
MCHC: 34.7 g/dL (ref 30.0–36.0)
MCV: 85 fL (ref 80.0–100.0)
Platelets: 181 10*3/uL (ref 150–400)
RBC: 5.28 MIL/uL (ref 4.22–5.81)
RDW: 13.1 % (ref 11.5–15.5)
WBC: 7.8 10*3/uL (ref 4.0–10.5)
nRBC: 0 % (ref 0.0–0.2)

## 2019-09-13 MED ORDER — FENTANYL CITRATE (PF) 100 MCG/2ML IJ SOLN: 100.0000 ug | Freq: Once | INTRAMUSCULAR | Status: DC

## 2019-09-13 MED ORDER — KETOROLAC TROMETHAMINE 30 MG/ML IJ SOLN: 15.0000 mg | Freq: Once | INTRAMUSCULAR | Status: DC

## 2019-09-13 MED ORDER — AZITHROMYCIN 250 MG PO TABS: ORAL_TABLET | ORAL | 0 refills | Status: AC

## 2019-09-13 MED ORDER — KETOROLAC TROMETHAMINE 30 MG/ML IJ SOLN
30.0000 mg | Freq: Once | INTRAMUSCULAR | Status: AC
  Administered 2019-09-13: 30 mg via INTRAVENOUS

## 2019-09-13 MED ORDER — IOHEXOL 300 MG/ML  SOLN
75.0000 mL | Freq: Once | INTRAMUSCULAR | Status: AC | PRN
  Administered 2019-09-13: 75 mL via INTRAVENOUS

## 2019-09-13 MED ORDER — SODIUM CHLORIDE 0.9 % IV BOLUS
1000.0000 mL | Freq: Once | INTRAVENOUS | Status: AC
  Administered 2019-09-13: 1000 mL via INTRAVENOUS

## 2019-09-13 MED ORDER — BUTALBITAL-APAP-CAFFEINE 50-325-40 MG PO TABS: 1.0000 | ORAL_TABLET | Freq: Four times a day (QID) | ORAL | 0 refills | Status: DC | PRN

## 2019-09-13 MED ORDER — METOCLOPRAMIDE HCL 5 MG/ML IJ SOLN
10.0000 mg | Freq: Once | INTRAMUSCULAR | Status: AC
  Administered 2019-09-13: 10 mg via INTRAVENOUS
  Filled 2019-09-13: qty 2

## 2019-09-13 MED ORDER — DIPHENHYDRAMINE HCL 50 MG/ML IJ SOLN
25.0000 mg | Freq: Once | INTRAMUSCULAR | Status: AC
  Administered 2019-09-13: 25 mg via INTRAVENOUS
  Filled 2019-09-13: qty 1

## 2019-09-13 MED ORDER — KETOROLAC TROMETHAMINE 30 MG/ML IJ SOLN
15.0000 mg | INTRAMUSCULAR | Status: DC
  Filled 2019-09-13: qty 1

## 2019-09-13 NOTE — ED Provider Notes (Signed)
[facility] Emergency Department Provider Note  Time seen: 1:31 PM  I have reviewed the triage vital signs and the nursing notes.   HISTORY  Chief Complaint Headache  HPI Ethan Perez is a 48 y.o. male with a past medical history of sinus cancer status post resection 09/2018, migraines as a teenager, presents to the emergency department for headache x5 days.  According to the patient for the past 5 days he has had a severe constant headache involving his entire head, pain behind both of his eyes.  Denies any weakness numbness confusion or slurred speech.  No fever.  No recent cough or shortness of breath.  Patient states he has had migraines in the past was not had one for approximately 30 years.  Patient had a sinus cancerous process status post resection by [facility]NT October of this past year, has not had any issues since then.  No chemotherapy or radiation therapy was required.  Patient spoke to the on-call physician who recommended he come to the emergency department for evaluation and CT imaging.   Past Medical History:  Diagnosis Date  . Asthma   . Cancer (HCC)    sinus cancer 09/2018  . History of concussion   . Hyperlipidemia   . IFG (impaired fasting glucose)   . Migraines   . Numbness and tingling of left thumb   . Obesity     Patient Active Problem List   Diagnosis Date Noted  . Preventative health care 05/06/2018  . Elevated BP without diagnosis of hypertension 12/24/2016  . Psoriasis 06/21/2016  . Migraines   . IFG (impaired fasting glucose)   . Obesity   . Hyperlipidemia     Past Surgical History:  Procedure Laterality Date  . KNEE SURGERY Left 2007   Orthoscopic  . LAMINECTOMY  Feb 2016  . NASAL SEPTUM SURGERY  2001  . sinus cancer removal  10/14/2018  . TONSILLECTOMY  1976    Prior to Admission medications   Medication Sig Start Date End Date Taking? Authorizing Provider  albuterol (PROVENTIL HFA;VENTOLIN HFA) 108 (90  Base) MCG/ACT inhaler Inhale 1-2 puffs into the lungs every 6 (six) hours as needed for wheezing or shortness of breath. 02/29/16   Norval Gable, MD  Ascorbic Acid (VITAMIN C) 100 MG tablet Take 100 mg by mouth daily.    [provider]  aspirin-acetaminophen-caffeine (EXCEDRIN MIGRAINE) [phone] MG per tablet Take 2 tablets by mouth every 6 (six) hours as needed for headache.    [provider]  triamcinolone cream (KENALOG) 0.5 % Apply 1 application topically 3 (three) times daily. If needed on rash; too strong for face, underarms, groin 05/06/18   Lada, Satira Anis, MD    Allergies  Allergen Reactions  . Tramadol Other (See Comments)    Sleep for 24 hr.  . Compazine [Prochlorperazine Edisylate] Other (See Comments)    Causes involuntary muscle movements.    Family History  Problem Relation Age of Onset  . Cirrhosis Mother        on diaylsis  . Seizures Mother        s/p hit her head  . Diabetes Mother   . Hypertension Mother   . Kidney disease Mother   . Migraines Father        with aura  . Heart disease Paternal Grandfather   . Lung disease Paternal Grandfather        worked in a Theme park manager  . Alcohol abuse Paternal Grandfather   .  Diabetes Brother   . Diabetes Maternal Grandmother   . Heart disease Maternal Grandmother   . Diabetes Maternal Grandfather   . Heart disease Maternal Grandfather   . Stroke Maternal Grandfather   . Heart disease Paternal Grandmother   . Cancer Paternal Aunt     Social History Social History   Tobacco Use  . Smoking status: Never Smoker  . Smokeless tobacco: Never Used  Substance Use Topics  . Alcohol use: No  . Drug use: No    Review of Systems Constitutional: Negative for fever. ENT: Denies sinus pressure or congestion. Cardiovascular: Negative for chest pain. Respiratory: Negative for shortness of breath.  Negative for cough. Gastrointestinal: Negative for abdominal pain Musculoskeletal: Negative for  musculoskeletal complaints Skin: Negative for skin complaints  Neurological: Positive for headache.  Negative for weakness or numbness. All other ROS negative  ____________________________________________   PHYSICAL EXAM:  VITAL SIGNS: ED Triage Vitals  Enc Vitals Group     BP 09/13/19 1158 119/78     Pulse Rate 09/13/19 1158 72     Resp 09/13/19 1158 16     Temp 09/13/19 1158 98.2 F (36.8 C)     Temp Source 09/13/19 1158 Oral     SpO2 09/13/19 1158 94 %     Weight 09/13/19 1156 (!) 304 lb 0.2 oz (137.9 kg)     Height 09/13/19 1156 6\' 4"  (1.93 m)     Head Circumference --      Peak Flow --      Pain Score 09/13/19 1156 7     Pain Loc --      Pain Edu? --      Excl. in [place]? --    Constitutional: Alert and oriented. Well appearing and in no distress. Eyes: Normal exam ENT      Head: Normocephalic and atraumatic.      Mouth/Throat: Mucous membranes are moist. Cardiovascular: Normal rate, regular rhythm.  Respiratory: Normal respiratory effort without tachypnea nor retractions. Breath sounds are clear  Gastrointestinal: Soft and nontender. No distention.   Musculoskeletal: Nontender with normal range of motion in all extremities.  Neurologic:  Normal speech and language. No gross focal neurologic deficits Skin:  Skin is warm, dry and intact.  Psychiatric: Mood and affect are normal.  ____________________________________________   RADIOLOGY  Right frontal sinus opacification otherwise no acute findings on CT scan of the maxillofacial area or head  ____________________________________________   INITIAL IMPRESSION / ASSESSMENT AND PLAN / ED COURSE  Pertinent labs & imaging results that were available during my care of the patient were reviewed by me and considered in my medical decision making (see chart for details).   Patient presents emergency department for 5 days of headache.  Differential would include tension headache, migraine headache, ICH or intracranial  mass/tumor, sinusitis, sinus tumor recurrence.  We will check labs, CT scan of the head and face with contrast.  We will treat the patient's headache and IV hydrate while awaiting results.  CT scans are largely nonrevealing besides right frontal sinusitis/opacification.  We will place the patient on antibiotics for possible sinusitis, Fioricet for headache and have the patient follow-up with his ENT.  Patient states his headache is currently a 2/10 in severity.  We will discharge the patient home.  Discussed my normal headache return precautions.  SHARIQ DEPOLO was evaluated in Emergency Department on 09/13/2019 for the symptoms described in the history of present illness. He was evaluated in the context of the [place] COVID-19  pandemic, which necessitated consideration that the patient might be at risk for infection with the SARS-CoV-2 virus that causes COVID-19. Institutional protocols and algorithms that pertain to the evaluation of patients at risk for COVID-19 are in a state of rapid change based on information released by regulatory bodies including the CDC and federal and state organizations. These policies and algorithms were followed during the patient's care in the ED.  ____________________________________________   FINAL CLINICAL IMPRESSION(S) / ED DIAGNOSES  Headache Sinusitis   Harvest Dark, MD 09/13/19 1547

## 2019-09-13 NOTE — ED Triage Notes (Signed)
C/O migraine headache since Wednesday.  Pain worsened yesterday to pain with eye movement.  Seen by a 'doctor on demand" and referred to ED for evaluation.  Last migraine headache was when patient was a teenager.  C/O pain to forehead and behind eyes.  Patient is AAOx3.  Skin warm and dry.  MAE equally and strong.  NAD

## 2019-10-08 ENCOUNTER — Other Ambulatory Visit: Payer: Self-pay

## 2019-10-08 DIAGNOSIS — Z20822 Contact with and (suspected) exposure to covid-19: Secondary | ICD-10-CM

## 2019-10-10 LAB — NOVEL CORONAVIRUS, NAA: SARS-CoV-2, NAA: DETECTED — AB

## 2019-10-14 ENCOUNTER — Other Ambulatory Visit: Payer: Self-pay

## 2019-10-14 ENCOUNTER — Encounter: Payer: Self-pay | Admitting: Emergency Medicine

## 2019-10-14 ENCOUNTER — Emergency Department
Admission: EM | Admit: 2019-10-14 | Discharge: 2019-10-14 | Disposition: A | Discharge status: Home / Self Care | Payer: 59 | Attending: Emergency Medicine | Admitting: Emergency Medicine

## 2019-10-14 DIAGNOSIS — R0789 Other chest pain: Secondary | ICD-10-CM | POA: Insufficient documentation

## 2019-10-14 DIAGNOSIS — Z79899 Other long term (current) drug therapy: Secondary | ICD-10-CM | POA: Diagnosis not present

## 2019-10-14 DIAGNOSIS — R0602 Shortness of breath: Secondary | ICD-10-CM | POA: Diagnosis present

## 2019-10-14 DIAGNOSIS — R05 Cough: Secondary | ICD-10-CM | POA: Diagnosis not present

## 2019-10-14 DIAGNOSIS — Z8522 Personal history of malignant neoplasm of nasal cavities, middle ear, and accessory sinuses: Secondary | ICD-10-CM | POA: Diagnosis not present

## 2019-10-14 DIAGNOSIS — U071 COVID-19: Secondary | ICD-10-CM | POA: Insufficient documentation

## 2019-10-14 DIAGNOSIS — J45909 Unspecified asthma, uncomplicated: Secondary | ICD-10-CM | POA: Diagnosis not present

## 2019-10-14 MED ORDER — ALBUTEROL SULFATE HFA 108 (90 BASE) MCG/ACT IN AERS: 2.0000 | INHALATION_SPRAY | Freq: Four times a day (QID) | RESPIRATORY_TRACT | 0 refills | Status: DC | PRN

## 2019-10-14 NOTE — ED Provider Notes (Signed)
[facility] Emergency Department Provider Note   ____________________________________________   I have reviewed the triage vital signs and the nursing notes.   HISTORY  Chief Complaint Shortness of Breath   History limited by: Not Limited   HPI Ethan Perez is a 48 y.o. male who presents to the emergency department today because of concern for shortness of breath in the setting of covid. The patient says he was diagnosed 8 days ago. Recently his shortness of breath has gotten worse. This has been accompanied by cough and chest tightness. Has a history of bronchitis. Currently does not have any breathing treatments at home. tmax of 101. Patient recently picked up [place].    Records reviewed. Per medical record review patient has a history of recent COVID positive test.   Past Medical History:  Diagnosis Date  . Asthma   . Cancer (HCC)    sinus cancer 09/2018  . History of concussion   . Hyperlipidemia   . IFG (impaired fasting glucose)   . Migraines   . Numbness and tingling of left thumb   . Obesity     Patient Active Problem List   Diagnosis Date Noted  . Preventative health care 05/06/2018  . Elevated BP without diagnosis of hypertension 12/24/2016  . Psoriasis 06/21/2016  . Migraines   . IFG (impaired fasting glucose)   . Obesity   . Hyperlipidemia     Past Surgical History:  Procedure Laterality Date  . KNEE SURGERY Left 2007   Orthoscopic  . LAMINECTOMY  Feb 2016  . NASAL SEPTUM SURGERY  2001  . sinus cancer removal  10/14/2018  . TONSILLECTOMY  1976    Prior to Admission medications   Medication Sig Start Date End Date Taking? Authorizing Provider  albuterol (PROVENTIL HFA;VENTOLIN HFA) 108 (90 Base) MCG/ACT inhaler Inhale 1-2 puffs into the lungs every 6 (six) hours as needed for wheezing or shortness of breath. 02/29/16   Norval Gable, MD  Ascorbic Acid (VITAMIN C) 100 MG tablet Take 100 mg by mouth daily.     [provider]  aspirin-acetaminophen-caffeine (EXCEDRIN MIGRAINE) [phone] MG per tablet Take 2 tablets by mouth every 6 (six) hours as needed for headache.    [provider]  butalbital-acetaminophen-caffeine (FIORICET) [phone] MG tablet Take 1-2 tablets by mouth every 6 (six) hours as needed for headache. 09/13/19 09/12/20  Harvest Dark, MD  triamcinolone cream (KENALOG) 0.5 % Apply 1 application topically 3 (three) times daily. If needed on rash; too strong for face, underarms, groin 05/06/18   Lada, Satira Anis, MD    Allergies Tramadol and Compazine [prochlorperazine edisylate]  Family History  Problem Relation Age of Onset  . Cirrhosis Mother        on diaylsis  . Seizures Mother        s/p hit her head  . Diabetes Mother   . Hypertension Mother   . Kidney disease Mother   . Migraines Father        with aura  . Heart disease Paternal Grandfather   . Lung disease Paternal Grandfather        worked in a Theme park manager  . Alcohol abuse Paternal Grandfather   . Diabetes Brother   . Diabetes Maternal Grandmother   . Heart disease Maternal Grandmother   . Diabetes Maternal Grandfather   . Heart disease Maternal Grandfather   . Stroke Maternal Grandfather   . Heart disease Paternal Grandmother   . Cancer Paternal Aunt  Social History Social History   Tobacco Use  . Smoking status: Never Smoker  . Smokeless tobacco: Never Used  Substance Use Topics  . Alcohol use: No  . Drug use: No    Review of Systems Constitutional: Positive for fever. Eyes: No visual changes. ENT: No sore throat. Cardiovascular: Denies chest pain. Respiratory: Positive for cough and shortness of breath. Gastrointestinal: No abdominal pain.  No nausea, no vomiting.  No diarrhea.   Genitourinary: Negative for dysuria. Musculoskeletal: Positive for body aches. Skin: Negative for rash. Neurological: Negative for headaches, focal weakness or  numbness.  ____________________________________________   PHYSICAL EXAM:  VITAL SIGNS: ED Triage Vitals  Enc Vitals Group     BP 10/14/19 1325 128/72     Pulse Rate 10/14/19 1325 93     Resp 10/14/19 1325 20     Temp 10/14/19 1325 98.4 F (36.9 C)     Temp Source 10/14/19 1325 Oral     SpO2 10/14/19 1325 95 %     Weight 10/14/19 1326 285 lb (129.3 kg)     Height 10/14/19 1326 6\' 4"  (1.93 m)     Head Circumference --      Peak Flow --      Pain Score 10/14/19 1326 0   Constitutional: Alert and oriented.  Eyes: Conjunctivae are normal.  ENT      Head: Normocephalic and atraumatic.      Nose: No congestion/rhinnorhea.      Mouth/Throat: Mucous membranes are moist.      Neck: No stridor. Cardiovascular: Normal rate, regular rhythm.  No murmurs, rubs, or gallops. Respiratory: Normal respiratory effort without tachypnea nor retractions. Slightly decreased air movement diffusely.  Gastrointestinal: Soft and non tender. No rebound. No guarding.  Genitourinary: Deferred Musculoskeletal: Normal range of motion in all extremities. No lower extremity edema. Neurologic:  Normal speech and language. No gross focal neurologic deficits are appreciated.  Skin:  Skin is warm, dry and intact. No rash noted. Psychiatric: Mood and affect are normal. Speech and behavior are normal. Patient exhibits appropriate insight and judgment.  ____________________________________________    LABS (pertinent positives/negatives)  None  ____________________________________________   EKG  None  ____________________________________________    RADIOLOGY  None  ____________________________________________   PROCEDURES  Procedures  ____________________________________________   INITIAL IMPRESSION / ASSESSMENT AND PLAN / ED COURSE  Pertinent labs & imaging results that were available during my care of the patient were reviewed by me and considered in my medical decision making (see  chart for details).   Patient presented to the emergency department today because of concern for shortness of breath and cough in the setting of covid. Did explain expected course of covid. Patient not hypoxic in the emergency department. Will plan on giving prescription for albuterol inhaler to help with symptoms. Discussed buying pulse oximeter to measure oxygen levels at home given possible worsening of SOB and hypoxia with course of disease.  ____________________________________________   FINAL CLINICAL IMPRESSION(S) / ED DIAGNOSES  Final diagnoses:  COVID-19  SOB (shortness of breath)     Note: This dictation was prepared with Dragon dictation. Any transcriptional errors that result from this process are unintentional     Nance Pear, MD 10/14/19 1415

## 2019-10-14 NOTE — ED Triage Notes (Signed)
Patient reports he was diagnosed with COVID on Sunday. States yesterday, he started having increasing SOB and cough. Patient states he has pain in chest with deep breath and coughing.

## 2019-10-14 NOTE — Discharge Instructions (Addendum)
As discussed you can buy a portable oxygen monitor from a pharmacy to keep track of your oxygen levels.

## 2019-12-08 LAB — BASIC METABOLIC PANEL
BUN: 15 (ref 4–21)
CO2: 24 — AB (ref 13–22)
Chloride: 102 (ref 99–108)
Creatinine: 1 (ref 0.6–1.3)
Glucose: 128
Potassium: 4.4 (ref 3.4–5.3)
Sodium: 139 (ref 137–147)

## 2019-12-08 LAB — LIPID PANEL
Cholesterol: 182 (ref 0–200)
HDL: 44 (ref 35–70)
LDL Cholesterol: 116
Triglycerides: 125 (ref 40–160)

## 2019-12-08 LAB — COMPREHENSIVE METABOLIC PANEL
Albumin: 4.4 (ref 3.5–5.0)
Calcium: 9.3 (ref 8.7–10.7)
GFR calc Af Amer: 106
GFR calc non Af Amer: 92
Globulin: 2.6

## 2019-12-08 LAB — HEMOGLOBIN A1C: Hemoglobin A1C: 6.1

## 2020-01-06 ENCOUNTER — Other Ambulatory Visit: Payer: Self-pay

## 2020-01-06 ENCOUNTER — Encounter: Payer: Self-pay | Admitting: Family Medicine

## 2020-01-06 ENCOUNTER — Ambulatory Visit: Payer: 59 | Admitting: Family Medicine

## 2020-01-06 VITALS — BP 123/86 | HR 85 | Temp 96.8°F | Resp 16 | Ht 76.0 in | Wt 309.0 lb

## 2020-01-06 DIAGNOSIS — J4520 Mild intermittent asthma, uncomplicated: Secondary | ICD-10-CM

## 2020-01-06 DIAGNOSIS — Z85820 Personal history of malignant melanoma of skin: Secondary | ICD-10-CM

## 2020-01-06 DIAGNOSIS — Z0000 Encounter for general adult medical examination without abnormal findings: Secondary | ICD-10-CM

## 2020-01-06 DIAGNOSIS — L409 Psoriasis, unspecified: Secondary | ICD-10-CM | POA: Diagnosis not present

## 2020-01-06 DIAGNOSIS — Z8522 Personal history of malignant neoplasm of nasal cavities, middle ear, and accessory sinuses: Secondary | ICD-10-CM

## 2020-01-06 DIAGNOSIS — E1169 Type 2 diabetes mellitus with other specified complication: Secondary | ICD-10-CM | POA: Diagnosis not present

## 2020-01-06 DIAGNOSIS — G43009 Migraine without aura, not intractable, without status migrainosus: Secondary | ICD-10-CM

## 2020-01-06 DIAGNOSIS — Z23 Encounter for immunization: Secondary | ICD-10-CM

## 2020-01-06 DIAGNOSIS — E785 Hyperlipidemia, unspecified: Secondary | ICD-10-CM

## 2020-01-06 DIAGNOSIS — K219 Gastro-esophageal reflux disease without esophagitis: Secondary | ICD-10-CM

## 2020-01-06 DIAGNOSIS — M7551 Bursitis of right shoulder: Secondary | ICD-10-CM | POA: Insufficient documentation

## 2020-01-06 DIAGNOSIS — J45909 Unspecified asthma, uncomplicated: Secondary | ICD-10-CM | POA: Insufficient documentation

## 2020-01-06 NOTE — Assessment & Plan Note (Signed)
New problem Recently treated for acute frozen shoulder in 06/2019 with corticosteroid injection and NSAIDs Seems that this has recovered and he has no signs of frozen shoulder at this time His rotator cuff strength is intact His symptoms and exam are consistent with bursitis at this time We discussed NSAIDs versus corticosteroid injection today Patient elects for steroid injection See procedure note

## 2020-01-06 NOTE — Assessment & Plan Note (Signed)
BMI of 37 associated with GERD, hyperlipidemia, T2DM, so morbid obesity Discussed importance of healthy weight management Discussed diet and exercise

## 2020-01-06 NOTE — Patient Instructions (Signed)
Preventive Care 40-49 Years Old, Male Preventive care refers to lifestyle choices and visits with your health care provider that can promote health and wellness. This includes:  A yearly physical exam. This is also called an annual well check.  Regular dental and eye exams.  Immunizations.  Screening for certain conditions.  Healthy lifestyle choices, such as eating a healthy diet, getting regular exercise, not using drugs or products that contain nicotine and tobacco, and limiting alcohol use. What can I expect for my preventive care visit? Physical exam Your health care provider will check:  Height and weight. These may be used to calculate body mass index (BMI), which is a measurement that tells if you are at a healthy weight.  Heart rate and blood pressure.  Your skin for abnormal spots. Counseling Your health care provider may ask you questions about:  Alcohol, tobacco, and drug use.  Emotional well-being.  Home and relationship well-being.  Sexual activity.  Eating habits.  Work and work environment. What immunizations do I need?  Influenza (flu) vaccine  This is recommended every year. Tetanus, diphtheria, and pertussis (Tdap) vaccine  You may need a Td booster every 10 years. Varicella (chickenpox) vaccine  You may need this vaccine if you have not already been vaccinated. Zoster (shingles) vaccine  You may need this after age 60. Measles, mumps, and rubella (MMR) vaccine  You may need at least one dose of MMR if you were born in 1957 or later. You may also need a second dose. Pneumococcal conjugate (PCV13) vaccine  You may need this if you have certain conditions and were not previously vaccinated. Pneumococcal polysaccharide (PPSV23) vaccine  You may need one or two doses if you smoke cigarettes or if you have certain conditions. Meningococcal conjugate (MenACWY) vaccine  You may need this if you have certain conditions. Hepatitis A vaccine   You may need this if you have certain conditions or if you travel or work in places where you may be exposed to hepatitis A. Hepatitis B vaccine  You may need this if you have certain conditions or if you travel or work in places where you may be exposed to hepatitis B. Haemophilus influenzae type b (Hib) vaccine  You may need this if you have certain risk factors. Human papillomavirus (HPV) vaccine  If recommended by your health care provider, you may need three doses over 6 months. You may receive vaccines as individual doses or as more than one vaccine together in one shot (combination vaccines). Talk with your health care provider about the risks and benefits of combination vaccines. What tests do I need? Blood tests  Lipid and cholesterol levels. These may be checked every 5 years, or more frequently if you are over 49 years old.  Hepatitis C test.  Hepatitis B test. Screening  Lung cancer screening. You may have this screening every year starting at age 49 if you have a 30-pack-year history of smoking and currently smoke or have quit within the past 15 years.  Prostate cancer screening. Recommendations will vary depending on your family history and other risks.  Colorectal cancer screening. All adults should have this screening starting at age 49 and continuing until age 49. Your health care provider may recommend screening at age 45 if you are at increased risk. You will have tests every 1-10 years, depending on your results and the type of screening test.  Diabetes screening. This is done by checking your blood sugar (glucose) after you have not eaten   for a while (fasting). You may have this done every 1-3 years.  Sexually transmitted disease (STD) testing. Follow these instructions at home: Eating and drinking  Eat a diet that includes fresh fruits and vegetables, whole grains, lean protein, and low-fat dairy products.  Take vitamin and mineral supplements as  recommended by your health care provider.  Do not drink alcohol if your health care provider tells you not to drink.  If you drink alcohol: ? Limit how much you have to 0-2 drinks a day. ? Be aware of how much alcohol is in your drink. In the [place] one drink equals one 12 oz bottle of beer (355 mL), one 5 oz glass of wine (148 mL), or one 1 oz glass of hard liquor (44 mL). Lifestyle  Take daily care of your teeth and gums.  Stay active. Exercise for at least 30 minutes on 5 or more days each week.  Do not use any products that contain nicotine or tobacco, such as cigarettes, e-cigarettes, and chewing tobacco. If you need help quitting, ask your health care provider.  If you are sexually active, practice safe sex. Use a condom or other form of protection to prevent STIs (sexually transmitted infections).  Talk with your health care provider about taking a low-dose aspirin every day starting at age 49. What's next?  Go to your health care provider 49 once a year for a well check visit.  Ask your health care provider how often you should have your eyes and teeth checked.  Stay up to date on all vaccines. This information is not intended to replace advice given to you by your health care provider. Make sure you discuss any questions you have with your health care provider. Document Revised: 12/04/2018 Document Reviewed: 12/04/2018 Elsevier Patient Education  2020 [employer]vier Inc.  

## 2020-01-06 NOTE — Assessment & Plan Note (Signed)
Chronic and intermittent Avoid triggers Lifestyle interventions discussed Weight loss would help Not on any meds currently

## 2020-01-06 NOTE — Assessment & Plan Note (Signed)
Chronic and intermittent Not on a preventive medication, and no indication for one at this time Avoid any possible triggers Continue Excedrin as needed Neuro intact

## 2020-01-06 NOTE — Assessment & Plan Note (Signed)
Chronic and stable Continue topicals as prescribed by dermatology Followed by dermatology

## 2020-01-06 NOTE — Assessment & Plan Note (Signed)
Associated with T2DM Discussed importance of good lipid control in the setting of diabetes to prevent heart disease Patient not currently on a statin and reports that he will never take a statin He recently had a lipid panel by doctors on call, so he will send [place] these results so that we can abstract them into his chart Discussed goal LDL less than 70 Follow-up at next visit in 3 months

## 2020-01-06 NOTE — Assessment & Plan Note (Signed)
Followed by dermatology

## 2020-01-06 NOTE — Progress Notes (Signed)
Patient: Ethan Perez, Male    DOB: [birth year], 49 y.o.   MRN: [id number] Visit Date: 01/06/2020  Today's Provider: Lavon Paganini, MD   Chief Complaint  Patient presents with  . Establish Care  . Annual Exam   Subjective:     Annual physical exam Ethan Perez is a 49 y.o. male who presents today for health maintenance and complete physical. He feels well. He reports exercising none. He reports he is sleeping fairly well.  History of concussion 4-5 years ago after hitting head at work.  No residual effects.  Previously being monitored for prediabetes by PCP.  Since seeing PCP last, he has been using doctors on call.  He was told that IFG progressed to T2DM and started Metformin ~4 months ago.  He is taking with good compliance and denies side effects.  Denies symptoms of hypoglycemia, polyuria, polydipsia, numbness extremities, foot ulcers/trauma. Has not heard about need to get screenings/vaccines.  He has been diagnosed with hyperlipidemia in the past as well.  He is not currently on a statin.  He states that he never wants to take a statin.  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  Followed by [facility].  Follows with them about q3-4 months  [facility] (Dr Dionne Bucy) follows him regularly for melanoma.  S/p resection within the last 6 months.  Migraines without aura: Infrequent, but vary based on sleep and other factors.  Not taking a preventive.  Takes Excedrin prn.  Was treated with Fioricet previously, but then it was found that headaches were actually from a sinus infection  Psoriasis :Using spray on steroid cream. Followed by Delaney Meigs No symptoms of psoriatic arthritis  GERD: Intermittent problem that seems to fluctuate with weight.  Knows that he needs to lose weight to control the symptoms.  Not currently on any medications for this.  Asthmatic bronchitis: Patient reports recurrent bronchitis in the setting of viral URIs with  significant wheezing and asthma exacerbation.  He does not take any controller inhalers.  He does use albuterol as needed.  He has needed steroids previously for these exacerbations.   R shoulder pain: Ongoing for about 6 months.  He was seen by Dr. Sabra Heck and treated for frozen shoulder.  He had a steroid injection and took meloxicam.  It helped and mostly got better, but he has been having worsening at night lately.  He denies any weakness/numbness, fever, redness, swelling, loss of range of motion.  It all started after he was working on his gutters, with overhead motion.  -----------------------------------------------------------   Review of Systems  Constitutional: Positive for fatigue.  Musculoskeletal: Positive for arthralgias.  Neurological: Positive for headaches.  All other systems reviewed and are negative.   Social History      He  reports that he has never smoked. He has never used smokeless tobacco. He reports that he does not drink alcohol or use drugs.       Social History   Socioeconomic History  . Marital status: Married    Spouse name: Not on file  . Number of children: 3  . Years of education: Not on file  . Highest education level: Not on file  Occupational History  . Not on file  Tobacco Use  . Smoking status: Never Smoker  . Smokeless tobacco: Never Used  Substance and Sexual Activity  . Alcohol use: No  . Drug use: No  . Sexual activity: Yes    Partners:  Female    Birth control/protection: Condom  Other Topics Concern  . Not on file  Social History Narrative  . Not on file   Social Determinants of Health   Financial Resource Strain:   . Difficulty of Paying Living Expenses: Not on file  Food Insecurity:   . Worried About Charity fundraiser in the Last Year: Not on file  . Ran Out of Food in the Last Year: Not on file  Transportation Needs:   . Lack of Transportation (Medical): Not on file  . Lack of Transportation (Non-Medical): Not on file   Physical Activity:   . Days of Exercise per Week: Not on file  . Minutes of Exercise per Session: Not on file  Stress:   . Feeling of Stress : Not on file  Social Connections:   . Frequency of Communication with Friends and Family: Not on file  . Frequency of Social Gatherings with Friends and Family: Not on file  . Attends Religious Services: Not on file  . Active Member of Clubs or Organizations: Not on file  . Attends Archivist Meetings: Not on file  . Marital Status: Not on file    Past Medical History:  Diagnosis Date  . Allergy   . Asthma   . Cancer (HCC)    sinus cancer 09/2018  . Diabetes mellitus without complication ([place])   . History of concussion   . Hyperlipidemia   . IFG (impaired fasting glucose)   . Melanoma ([place])   . Migraines   . Numbness and tingling of left thumb   . Obesity   . Ruptured disc, cervical 2015     Patient Active Problem List   Diagnosis Date Noted  . Acute bursitis of right shoulder 01/06/2020  . Gastroesophageal reflux disease 01/06/2020  . History of melanoma 01/06/2020  . Reactive airway disease 01/06/2020  . History of sinus cancer 01/06/2020  . Preventative health care 05/06/2018  . Elevated BP without diagnosis of hypertension 12/24/2016  . Psoriasis 06/21/2016  . Migraines   . Type 2 diabetes mellitus with other specified complication ([place])   . Obesity   . Hyperlipidemia     Past Surgical History:  Procedure Laterality Date  . EYE SURGERY    . KNEE SURGERY Left 2007   Arthoscopic  . LAMINECTOMY  Feb 2016  . MELANOMA EXCISION    . NASAL SEPTUM SURGERY  2001  . sinus cancer removal  10/14/2018  . TONSILLECTOMY  1976    Family History        Family Status  Relation Name Status  . Mother  Deceased       multiple issues  . Father  Alive  . PGF  Deceased at age 51  . Brother  (Not Specified)  . MGM  (Not Specified)  . MGF  (Not Specified)  . PGM  (Not Specified)  . Ethlyn Daniels  Deceased        His  family history includes Alcohol abuse in his paternal grandfather; Cancer in his paternal aunt; Cirrhosis in his mother; Diabetes in his brother, maternal grandfather, maternal grandmother, and mother; Heart disease in his maternal grandfather, maternal grandmother, mother, paternal grandfather, and paternal grandmother; Hypertension in his mother; Kidney failure in his mother; Lung disease in his paternal grandfather; Migraines in his brother and father; Seizures in his mother; Stroke in his maternal grandfather.      Allergies  Allergen Reactions  . Tramadol Other (See Comments)  Sleep for 24 hr.  . Compazine [Prochlorperazine Edisylate] Other (See Comments)    Causes involuntary muscle movements.     Current Outpatient Medications:  .  albuterol (PROVENTIL HFA;VENTOLIN HFA) 108 (90 Base) MCG/ACT inhaler, Inhale 1-2 puffs into the lungs every 6 (six) hours as needed for wheezing or shortness of breath., Disp: 1 Inhaler, Rfl: 0 .  Ascorbic Acid (VITAMIN C) 100 MG tablet, Take 100 mg by mouth daily., Disp: , Rfl:  .  aspirin-acetaminophen-caffeine (EXCEDRIN MIGRAINE) 250-250-65 MG per tablet, Take 2 tablets by mouth every 6 (six) hours as needed for headache., Disp: , Rfl:  .  metFORMIN (GLUCOPHAGE) 500 MG tablet, Take 1,000 mg by mouth 2 (two) times daily., Disp: , Rfl:  .  triamcinolone cream (KENALOG) 0.5 %, Apply 1 application topically 3 (three) times daily. If needed on rash; too strong for face, underarms, groin, Disp: 30 g, Rfl: 2 .  Vitamin D, Ergocalciferol, (DRISDOL) 1.25 MG (50000 UNIT) CAPS capsule, Take 50,000 Units by mouth once a week., Disp: , Rfl:    Patient Care Team: Virginia Crews, MD as PCP - General (Family Medicine)    Objective:    Vitals: BP 123/86 (BP Location: Left Arm, Patient Position: Sitting, Cuff Size: Large)   Pulse 85   Temp (!) 96.8 F (36 C) (Other (Comment))   Resp 16   Ht 6\' 4"  (1.93 m)   Wt (!) 309 lb (140.2 kg)   SpO2 95%   BMI 37.61  kg/m    Vitals:   01/06/20 0913  BP: 123/86  Pulse: 85  Resp: 16  Temp: (!) 96.8 F (36 C)  TempSrc: Other (Comment)  SpO2: 95%  Weight: (!) 309 lb (140.2 kg)  Height: 6\' 4"  (1.93 m)     Physical Exam Vitals reviewed.  Constitutional:      General: He is not in acute distress.    Appearance: Normal appearance. He is well-developed. He is not diaphoretic.  HENT:     Head: Normocephalic and atraumatic.     Right Ear: Tympanic membrane, ear canal and external ear normal.     Left Ear: Tympanic membrane, ear canal and external ear normal.  Eyes:     General: No scleral icterus.    Conjunctiva/sclera: Conjunctivae normal.     Pupils: Pupils are equal, round, and reactive to light.  Neck:     Thyroid: No thyromegaly.  Cardiovascular:     Rate and Rhythm: Normal rate and regular rhythm.     Pulses: Normal pulses.     Heart sounds: Normal heart sounds. No murmur.  Pulmonary:     Effort: Pulmonary effort is normal. No respiratory distress.     Breath sounds: Normal breath sounds. No wheezing or rales.  Abdominal:     General: There is no distension.     Palpations: Abdomen is soft.     Tenderness: There is no abdominal tenderness.  Musculoskeletal:        General: No deformity.     Cervical back: Neck supple.     Right lower leg: No edema.     Left lower leg: No edema.     Comments: Right shoulder: Range of motion is intact with no signs of frozen shoulder.  Rotator cuff strength is intact.  No obvious deformities.  Mild tenderness to palpation over anterior shoulder.  No swelling or erythema.  Positive Hawkins.  Sensation grossly intact.  Lymphadenopathy:     Cervical: No cervical adenopathy.  Skin:  General: Skin is warm and dry.     Capillary Refill: Capillary refill takes less than 2 seconds.     Findings: Rash (psoriasis patch noted on left elbow) present.  Neurological:     Mental Status: He is alert and oriented to person, place, and time. Mental status is at  baseline.     Sensory: No sensory deficit.     Motor: No weakness.     Gait: Gait normal.  Psychiatric:        Mood and Affect: Mood normal.        Behavior: Behavior normal.        Thought Content: Thought content normal.      Depression Screen PHQ 2/9 Scores 01/06/2020 01/23/2019 05/06/2018 12/21/2016  PHQ - 2 Score 0 0 0 0  PHQ- 9 Score 3 0 - -       Assessment & Plan:     Routine Health Maintenance and Physical Exam  Exercise Activities and Dietary recommendations Goals   None     Immunization History  Administered Date(s) Administered  . Influenza,inj,Quad PF,6+ Mos 12/21/2015, 01/06/2020  . Influenza-Unspecified 09/02/2014  . Tdap 12/24/2010    Health Maintenance  Topic Date Due  . PNEUMOCOCCAL POLYSACCHARIDE VACCINE AGE 82-64 HIGH RISK  09/20/1973  . OPHTHALMOLOGY EXAM  09/20/1981  . URINE MICROALBUMIN  09/20/1981  . HEMOGLOBIN A1C  11/06/2018  . TETANUS/TDAP  12/24/2020  . FOOT EXAM  01/05/2021  . INFLUENZA VACCINE  Completed  . HIV Screening  Completed     Discussed health benefits of physical activity, and encouraged him to engage in regular exercise appropriate for his age and condition.    --------------------------------------------------------------------  Problem List Items Addressed This Visit      Cardiovascular and Mediastinum   Migraines    Chronic and intermittent Not on a preventive medication, and no indication for one at this time Avoid any possible triggers Continue Excedrin as needed Neuro intact        Respiratory   Reactive airway disease    Patient with reactive airway disease with recurrent asthmatic bronchitis Not on a preventive/controller inhaler at this time Continue albuterol as needed If develops upper respiratory infection with wheezing, may need steroid burst        Digestive   Gastroesophageal reflux disease    Chronic and intermittent Avoid triggers Lifestyle interventions discussed Weight loss would  help Not on any meds currently        Endocrine   Type 2 diabetes mellitus with other specified complication [facility])    Recent diagnosis by doctors on-call Associated with/complicated by HLD, GERD, morbid obesity He had an A1c drawn last month that was well controlled He will send [place] his recent labs so that we can abstract them into his chart Continue Metformin 1000 mg twice daily Discussed role of statins and diabetes and importance of good cholesterol control Patient is adamant that he will not take a statin We will check his lipid panel today Foot exam completed today Patient declines pneumonia vaccine at this time, but will consider in the future Advised on need for eye exam Needs urine microalbumin at next visit      Relevant Medications   metFORMIN (GLUCOPHAGE) 500 MG tablet     Musculoskeletal and Integument   Psoriasis (Chronic)    Chronic and stable Continue topicals as prescribed by dermatology Followed by dermatology      Acute bursitis of right shoulder    New problem Recently treated for  acute frozen shoulder in 06/2019 with corticosteroid injection and NSAIDs Seems that this has recovered and he has no signs of frozen shoulder at this time His rotator cuff strength is intact His symptoms and exam are consistent with bursitis at this time We discussed NSAIDs versus corticosteroid injection today Patient elects for steroid injection See procedure note        Other   Obesity (Chronic)    BMI of 37 associated with GERD, hyperlipidemia, T2DM, so morbid obesity Discussed importance of healthy weight management Discussed diet and exercise       Relevant Medications   metFORMIN (GLUCOPHAGE) 500 MG tablet   Hyperlipidemia (Chronic)    Associated with T2DM Discussed importance of good lipid control in the setting of diabetes to prevent heart disease Patient not currently on a statin and reports that he will never take a statin He recently had a lipid panel by  doctors on call, so he will send [place] these results so that we can abstract them into his chart Discussed goal LDL less than 70 Follow-up at next visit in 3 months      History of melanoma    Followed by dermatology      History of sinus cancer    Followed by ENT       Other Visit Diagnoses    Encounter for annual physical exam    -  Primary   Need for influenza vaccination       Relevant Orders   Flu Vaccine QUAD 36+ mos IM (Completed)      INJECTION: Patient was given informed consent,. Appropriate time out was taken. Area prepped and draped in usual sterile fashion. 2 cc of depo-medrol 40 mg/ml plus  4 cc of 1% lidocaine without epinephrine was injected into the R shoulder subacromial space using a(n) posterior approach. The patient tolerated the procedure well. There were no complications. Post procedure instructions were given.    Return in about 3 months (around 04/05/2020) for chronic disease f/u.   The entirety of the information documented in the History of Present Illness, Review of Systems and Physical Exam were personally obtained by me. Portions of this information were initially documented by April Ival Bible, CMA and reviewed by me for thoroughness and accuracy.    Denarius Sesler, Dionne Bucy, MD MPH [facility]

## 2020-01-06 NOTE — Assessment & Plan Note (Addendum)
Recent diagnosis by doctors on-call Associated with/complicated by HLD, GERD, morbid obesity He had an A1c drawn last month that was well controlled He will send [place] his recent labs so that we can abstract them into his chart Continue Metformin 1000 mg twice daily Discussed role of statins and diabetes and importance of good cholesterol control Patient is adamant that he will not take a statin We will check his lipid panel today Foot exam completed today Patient declines pneumonia vaccine at this time, but will consider in the future Advised on need for eye exam Needs urine microalbumin at next visit

## 2020-01-06 NOTE — Assessment & Plan Note (Signed)
Patient with reactive airway disease with recurrent asthmatic bronchitis Not on a preventive/controller inhaler at this time Continue albuterol as needed If develops upper respiratory infection with wheezing, may need steroid burst

## 2020-01-06 NOTE — Assessment & Plan Note (Signed)
Followed by ENT 

## 2020-01-07 ENCOUNTER — Encounter: Payer: Self-pay | Admitting: Family Medicine

## 2020-03-08 ENCOUNTER — Telehealth: Payer: Self-pay

## 2020-03-08 ENCOUNTER — Encounter: Payer: Self-pay | Admitting: Dermatology

## 2020-03-08 ENCOUNTER — Ambulatory Visit: Payer: 59 | Admitting: Dermatology

## 2020-03-08 ENCOUNTER — Other Ambulatory Visit: Payer: Self-pay

## 2020-03-08 DIAGNOSIS — D1801 Hemangioma of skin and subcutaneous tissue: Secondary | ICD-10-CM

## 2020-03-08 DIAGNOSIS — L578 Other skin changes due to chronic exposure to nonionizing radiation: Secondary | ICD-10-CM | POA: Diagnosis not present

## 2020-03-08 DIAGNOSIS — L720 Epidermal cyst: Secondary | ICD-10-CM

## 2020-03-08 DIAGNOSIS — L918 Other hypertrophic disorders of the skin: Secondary | ICD-10-CM | POA: Diagnosis not present

## 2020-03-08 DIAGNOSIS — L821 Other seborrheic keratosis: Secondary | ICD-10-CM

## 2020-03-08 DIAGNOSIS — D229 Melanocytic nevi, unspecified: Secondary | ICD-10-CM

## 2020-03-08 DIAGNOSIS — Z85820 Personal history of malignant melanoma of skin: Secondary | ICD-10-CM

## 2020-03-08 DIAGNOSIS — L814 Other melanin hyperpigmentation: Secondary | ICD-10-CM

## 2020-03-08 DIAGNOSIS — Z86018 Personal history of other benign neoplasm: Secondary | ICD-10-CM

## 2020-03-08 MED ORDER — MUPIROCIN 2 % EX OINT: 1.0000 "application " | TOPICAL_OINTMENT | Freq: Every day | CUTANEOUS | 0 refills | Status: DC

## 2020-03-08 NOTE — Patient Instructions (Signed)

## 2020-03-08 NOTE — Progress Notes (Addendum)
Follow-Up Visit   Subjective  Ethan Perez is a 49 y.o. male who presents for the following: Cyst (right post neck). The patient also presents for Total Body Skin Exam for Malignant Melanoma in of the back in 2020 and Dysplastic nevi in 2020. Bump Under Skin His subcutaneous nodule is located over the DERM LESION LOCATION: right post neck. This has been present for a few month(s). There has been swelling. He does have a history of epidermal inclusion cysts. He does not have a history of lipomas.   The following portions of the chart were reviewed this encounter and updated as appropriate:     Review of Systems: No other skin or systemic complaints.  Objective  Well appearing patient in no apparent distress; mood and affect are within normal limits.  A full examination was performed including scalp, head, eyes, ears, nose, lips, neck, chest, axillae, abdomen, back, buttocks, bilateral upper extremities, bilateral lower extremities, hands, feet, fingers, toes, fingernails, and toenails. All findings within normal limits unless otherwise noted below.  Objective  generalized: Diffuse scaly erythematous macules with underlying dyspigmentation.   Objective  right post neck: 1.1 x 0.5 cm Cystic papule  Objective  left lat forehead, trunk: Red papules.   Objective  Multiple: Well healed dysplastic nevi sites.  Objective  right mid back paraspinal, sup med: Well healed excision site. Clear to visual exam and palpation. No lymphadenopathy.  Objective  face, shoulders: Light brown macules.  Objective  Left Preauricular Area, trunk: Flesh colored papule of left preauricular. Regular brown macules and papules of trunk.  Objective  bilat feet: Stuck-on, waxy, tan-brown papule or plaque --Discussed benign etiology and prognosis.   Objective  bilat axilla: Fleshy, skin-colored pedunculated papules.  Assessment & Plan  Actinic skin damage generalized  Epidermal cyst  right post neck  Skin repair - right post neck Complexity:  Complex Final length (cm):  2 Informed consent: discussed and consent obtained   Timeout: patient name, date of birth, surgical site, and procedure verified   Procedure prep:  Patient was prepped and draped in usual sterile fashion Prep type:  Povidone-iodine Anesthesia: the lesion was anesthetized in a standard fashion   Anesthetic:  1% lidocaine w/ epinephrine 1-100,000 buffered w/ 8.4% NaHCO3 Reason for type of repair: reduce tension to allow closure, reduce the risk of dehiscence, infection, and necrosis, reduce subcutaneous dead space and avoid a hematoma, allow closure of the large defect, preserve normal anatomy, preserve normal anatomical and functional relationships and enhance both functionality and cosmetic results   Undermining: area extensively undermined   Undermining comment:  Undermining defect 0.6 cm Subcutaneous layers (deep stitches):  Suture size:  4-0 Suture type: Vicryl (polyglactin 910)   Subcutaneous suture technique: inverted dermal. Fine/surface layer approximation (top stitches):  Suture size:  4-0 Suture type comment:  Nylon Stitches: horizontal mattress   Suture removal (days):  7 Hemostasis achieved with: suture and pressure Hemostasis achieved with comment:  Electrocautery Outcome: patient tolerated procedure well with no complications   Post-procedure details: sterile dressing applied and wound care instructions given   Dressing type: bandage and pressure dressing (mupirocin)    Skin excision - right post neck  Lesion length (cm):  1.1 Lesion width (cm):  0.5 Margin per side (cm):  0.1 Total excision diameter (cm):  1.3 Informed consent: discussed and consent obtained   Timeout: patient name, date of birth, surgical site, and procedure verified   Procedure prep:  Patient was prepped and draped in usual  sterile fashion Prep type:  Isopropyl alcohol and povidone-iodine Anesthesia: the  lesion was anesthetized in a standard fashion   Anesthetic:  1% lidocaine w/ epinephrine 1-100,000 buffered w/ 8.4% NaHCO3 Instrument used: #15 blade   Hemostasis achieved with: pressure   Hemostasis achieved with comment:  Electrocautery Outcome: patient tolerated procedure well with no complications   Post-procedure details: sterile dressing applied and wound care instructions given   Dressing type: bandage and pressure dressing (mupirocin)    Specimen 1 - Surgical pathology Differential Diagnosis: Cyst vs other D48.5 Check Margins: No 1.1 x 0.5 cm Cystic papule   Hemangioma of skin (2) trunk; left lat forehead  History of dysplastic nevus Multiple  History of melanoma right mid back paraspinal, sup med  Lentigines face, shoulders  Nevus (2) trunk; Left Preauricular Area  Seborrheic keratosis bilat feet  Skin tag bilat axilla   Return in about 1 week (around 03/15/2020) for suture removal.

## 2020-03-09 NOTE — Telephone Encounter (Signed)
Pt doing fine after todays surgery.  No evidence of bleeding or pain.

## 2020-03-14 ENCOUNTER — Other Ambulatory Visit: Payer: Self-pay

## 2020-03-14 ENCOUNTER — Encounter: Payer: Self-pay | Admitting: Dermatology

## 2020-03-14 ENCOUNTER — Ambulatory Visit (INDEPENDENT_AMBULATORY_CARE_PROVIDER_SITE_OTHER): Payer: 59 | Admitting: Dermatology

## 2020-03-14 DIAGNOSIS — Z4802 Encounter for removal of sutures: Secondary | ICD-10-CM

## 2020-03-14 NOTE — Progress Notes (Signed)
   Follow-Up Visit   Subjective  Ethan Perez is a 49 y.o. male who presents for the following: Suture / Staple Removal (Right post neck).   The following portions of the chart were reviewed this encounter and updated as appropriate:     Review of Systems: No other skin or systemic complaints.  Objective  Well appearing patient in no apparent distress; mood and affect are within normal limits.  A focused examination was performed including right post neck. Relevant physical exam findings are noted in the Assessment and Plan.  Objective  Neck - Posterior: Bx proven Benign cyst, well healed  Assessment & Plan  Encounter for removal of sutures Neck - Posterior  Well healed scar of Bx proven cyst.  Recommend no diving for the next few day due to risk of infection.  Can use a waterproof bandage if desired.  Return for as scheduled.   Marene Lenz, CMA, am acting as scribe for Sarina Ser, MD .

## 2020-03-15 ENCOUNTER — Ambulatory Visit: Payer: 59

## 2020-03-30 ENCOUNTER — Other Ambulatory Visit: Payer: Self-pay

## 2020-03-30 ENCOUNTER — Telehealth: Payer: Self-pay

## 2020-03-30 MED ORDER — METFORMIN HCL 500 MG PO TABS: 1000.0000 mg | ORAL_TABLET | Freq: Two times a day (BID) | ORAL | 3 refills | Status: DC

## 2020-03-30 NOTE — Telephone Encounter (Addendum)
FYI-have sent in 30 x 3.  Pt was seen on 01/06/20 as new patient with A1C in December of 6.1.  Note in January stated to stay on Metformin and, pt has app't 04/05/20   Copied from [place] [id number]. Topic: General - Inquiry >> Mar 30, 2020  4:38 PM Percell Belt A wrote: Reason for CRM: pt called in and stated that he needs a refill on his metFORMIN (GLUCOPHAGE) 500 MG tablet [id number] , I did not sent to [facility] because Dr B has never filled this for pt.  Pt stated he has tried to contact other dr to fill it and he has had no luck.  He would like to know if Dr B will call it in for him [facility] in [place]

## 2020-04-05 ENCOUNTER — Encounter: Payer: Self-pay | Admitting: Family Medicine

## 2020-04-05 ENCOUNTER — Ambulatory Visit: Payer: 59 | Admitting: Family Medicine

## 2020-04-05 ENCOUNTER — Other Ambulatory Visit: Payer: Self-pay

## 2020-04-05 ENCOUNTER — Telehealth: Payer: Self-pay

## 2020-04-05 VITALS — BP 129/87 | HR 81 | Temp 97.6°F | Resp 16 | Ht 76.0 in | Wt 312.0 lb

## 2020-04-05 DIAGNOSIS — N401 Enlarged prostate with lower urinary tract symptoms: Secondary | ICD-10-CM

## 2020-04-05 DIAGNOSIS — E1169 Type 2 diabetes mellitus with other specified complication: Secondary | ICD-10-CM

## 2020-04-05 DIAGNOSIS — R030 Elevated blood-pressure reading, without diagnosis of hypertension: Secondary | ICD-10-CM | POA: Diagnosis not present

## 2020-04-05 DIAGNOSIS — E7849 Other hyperlipidemia: Secondary | ICD-10-CM

## 2020-04-05 DIAGNOSIS — Z23 Encounter for immunization: Secondary | ICD-10-CM | POA: Diagnosis not present

## 2020-04-05 DIAGNOSIS — N3943 Post-void dribbling: Secondary | ICD-10-CM | POA: Insufficient documentation

## 2020-04-05 LAB — POCT GLYCOSYLATED HEMOGLOBIN (HGB A1C)
Est. average glucose Bld gHb Est-mCnc: 131
Hemoglobin A1C: 6.2 % — AB (ref 4.0–5.6)

## 2020-04-05 LAB — POCT UA - MICROALBUMIN: Microalbumin Ur, POC: 50 mg/L

## 2020-04-05 MED ORDER — LISINOPRIL 5 MG PO TABS: 5.0000 mg | ORAL_TABLET | Freq: Every day | ORAL | 1 refills | Status: DC

## 2020-04-05 MED ORDER — METFORMIN HCL ER 500 MG PO TB24: 1000.0000 mg | ORAL_TABLET | Freq: Two times a day (BID) | ORAL | 1 refills | Status: DC

## 2020-04-05 MED ORDER — TAMSULOSIN HCL 0.4 MG PO CAPS: 0.4000 mg | ORAL_CAPSULE | Freq: Every day | ORAL | 3 refills | Status: DC

## 2020-04-05 NOTE — Assessment & Plan Note (Signed)
Discussed importance of healthy weight management Discussed diet and exercise  

## 2020-04-05 NOTE — Patient Instructions (Signed)

## 2020-04-05 NOTE — Assessment & Plan Note (Addendum)
Well controlled Continue current medications UTD on eye exam, foot exam Pneumovax today Urine microalbumin Not on statin Discussed diet and exercise F/u in 6 months

## 2020-04-05 NOTE — Telephone Encounter (Signed)
-----   Message from Virginia Crews, MD sent at 04/05/2020  2:13 PM EDT ----- Patient's microscopic protein levels in his urine are elevated.  This can be a very early sign of future kidney damage from the diabetes.  Typically, we would use a low dose of a medication like lisinopril to protect the kidneys from the diabetes.  If patient is agreeable, we can start lisinopril 5 mg daily

## 2020-04-05 NOTE — Assessment & Plan Note (Signed)
Refuses statin therapy States he will never take one

## 2020-04-05 NOTE — Assessment & Plan Note (Signed)
New problem Track IPSS scores Check PSA Start Flomax 0.4mg  daily  F/u in 3 months

## 2020-04-05 NOTE — Telephone Encounter (Signed)
Pt advised.  He agreed to start Lisinopril 5mg  a day.  He also needs a refill of metformin sent to [facility],   -Mickel Baas

## 2020-04-05 NOTE — Assessment & Plan Note (Signed)
Elevated today - suspects this is related to lack of sleep and stress Better on manual recheck

## 2020-04-05 NOTE — Progress Notes (Signed)
Established patient visit   Patient: Ethan Perez   DOB: 1971/07/14   49 y.o. Male  MRN: [id number] Visit Date: 04/05/2020 I,Sulibeya S Dimas,acting as a scribe for Lavon Paganini, MD.,have documented all relevant documentation on the behalf of Lavon Paganini, MD,as directed by  Lavon Paganini, MD while in the presence of Lavon Paganini, MD.  Today's healthcare provider: Lavon Paganini, MD  Subjective:    Chief Complaint  Patient presents with  . Diabetes  . Hypertension  . Hyperlipidemia   HPI  Patient C/O not being able to completely void x's several years. Patient reports dribbling after voiding.  No hematuria, dysuria, frequency/urgency.  Diabetes Mellitus Type II, Follow-up  Lab Results  Component Value Date   HGBA1C 6.2 (A) 04/05/2020   HGBA1C 6.1 12/08/2019   HGBA1C 5.4 05/06/2018   Last seen for diabetes 3 months ago.  Management since then includes continuing the same treatment. He reports excellent compliance with treatment. He is not having side effects.   Home blood sugar records: fasting range: 130's  Episodes of hypoglycemia? Yes    Current insulin regiment: not on insuline Most Recent Eye Exam: UTD  ---------------------------------------------------------------------------------------------------------------------- Hypertension, follow-up  BP Readings from Last 3 Encounters:  04/05/20 129/87  01/06/20 123/86  10/14/19 128/72   He was last seen for hypertension 3 months ago.  BP at that visit was 123/86. Management since that visit includes no changes. He is not exercising. He is not adherent to low salt diet.   Outside blood pressures are not being checked.  He does not smoke.  Use of agents associated with hypertension: none.   ---------------------------------------------------------------------------------------------------------------------- Lipid/Cholesterol, Follow-up  Last Lipid Panel:    Component Value  Date/Time   CHOL 182 12/08/2019 0000   CHOL 196 12/21/2015 1012   TRIG 125 12/08/2019 0000   HDL 44 12/08/2019 0000   HDL 39 (L) 12/21/2015 1012   CHOLHDL 4.9 05/06/2018 1542   VLDL 53 (H) 12/21/2016 1200   LDLCALC 116 12/08/2019 0000   LDLCALC  05/06/2018 1542     Comment:     . LDL cholesterol not calculated. Triglyceride levels greater than 400 mg/dL invalidate calculated LDL results. . Reference range: <100 . Desirable range <100 mg/dL for primary prevention;   <70 mg/dL for patients with CHD or diabetic patients  with > or = 2 CHD risk factors. Marland Kitchen LDL-C is now calculated using the Martin-Hopkins  calculation, which is a validated novel method providing  better accuracy than the Friedewald equation in the  estimation of LDL-C.  Cresenciano Genre et al. Annamaria Helling. [id number]): 2061-2068  (http://education.[facility].com/faq/FAQ164)     He was last seen for this 3 months ago.  Management since that visit includes no change.  Symptoms: No appetite changes No foot ulcerations No chest pain No chest pressure/discomfort No dyspnea No fatigue No lower extremity edema No nausea No numbness or tingling of extremity No orthopnea No palpitations No paroxysmal nocturnal dyspnea No polydipsia No polyuria No speech difficulty No syncope No visual disturbances   He is following a Regular diet. Current exercise: none  Wt Readings from Last 3 Encounters:  04/05/20 (!) 312 lb (141.5 kg)  01/06/20 (!) 309 lb (140.2 kg)  10/14/19 285 lb (129.3 kg)   Last metabolic panel Lab Results  Component Value Date   GLUCOSE 117 (H) 09/13/2019   NA 139 12/08/2019   K 4.4 12/08/2019   CL 102 12/08/2019   CO2 24 (A) 12/08/2019   BUN 15 12/08/2019  CREATININE 1.0 12/08/2019   GFRNONAA 92 12/08/2019   GFRAA 106 12/08/2019   CALCIUM 9.3 12/08/2019   PROT 7.5 05/06/2018   ALBUMIN 4.4 12/08/2019   LABGLOB 3.0 12/21/2015   AGRATIO 1.4 12/21/2015   BILITOT 0.4 05/06/2018   ALKPHOS  54 12/21/2016   AST 28 05/06/2018   ALT 30 05/06/2018   ANIONGAP 7 09/13/2019   The 10-year ASCVD risk score Mikey Bussing DC Jr., et al., 2013) is: 5.4%* (Cholesterol units were assumed)  -------------------------------------------------------------------------------------------------------------    Patient Active Problem List   Diagnosis Date Noted  . Benign prostatic hyperplasia with post-void dribbling 04/05/2020  . Acute bursitis of right shoulder 01/06/2020  . Gastroesophageal reflux disease 01/06/2020  . History of melanoma 01/06/2020  . Reactive airway disease 01/06/2020  . History of sinus cancer 01/06/2020  . Preventative health care 05/06/2018  . Elevated BP without diagnosis of hypertension 12/24/2016  . Psoriasis 06/21/2016  . Migraines   . Type 2 diabetes mellitus with other specified complication ([place])   . Morbid obesity ([place])   . Hyperlipidemia    Social History   Tobacco Use  . Smoking status: Never Smoker  . Smokeless tobacco: Never Used  Substance Use Topics  . Alcohol use: No  . Drug use: No       Medications: Outpatient Medications Prior to Visit  Medication Sig  . albuterol (PROVENTIL HFA;VENTOLIN HFA) 108 (90 Base) MCG/ACT inhaler Inhale 1-2 puffs into the lungs every 6 (six) hours as needed for wheezing or shortness of breath.  Marland Kitchen aspirin-acetaminophen-caffeine (EXCEDRIN MIGRAINE) 250-250-65 MG per tablet Take 2 tablets by mouth every 6 (six) hours as needed for headache.  . metFORMIN (GLUCOPHAGE-XR) 500 MG 24 hr tablet Take 1,000 mg by mouth 2 (two) times daily.  . [DISCONTINUED] metFORMIN (GLUCOPHAGE) 500 MG tablet Take 2 tablets (1,000 mg total) by mouth 2 (two) times daily.  . Ascorbic Acid (VITAMIN C) 100 MG tablet Take 100 mg by mouth daily.  . mupirocin ointment (BACTROBAN) 2 % Apply 1 application topically daily. (Patient not taking: Reported on 04/05/2020)  . triamcinolone cream (KENALOG) 0.5 % Apply 1 application topically 3 (three) times daily.  If needed on rash; too strong for face, underarms, groin  . Vitamin D, Ergocalciferol, (DRISDOL) 1.25 MG (50000 UNIT) CAPS capsule Take 50,000 Units by mouth once a week.   No facility-administered medications prior to visit.    Review of Systems  Constitutional: Negative.   Respiratory: Negative.   Cardiovascular: Negative.   Endocrine: Negative.   Genitourinary: Positive for difficulty urinating. Negative for decreased urine volume, discharge, dysuria, enuresis, flank pain, frequency, genital sores, hematuria, penile pain, penile swelling, scrotal swelling, testicular pain and urgency.  Neurological: Negative.   Hematological: Negative.         Objective:    BP 129/87 (BP Location: Left Arm, Patient Position: Sitting, Cuff Size: Large)   Pulse 81   Temp 97.6 F (36.4 C) (Temporal)   Resp 16   Ht 6\' 4"  (1.93 m)   Wt (!) 312 lb (141.5 kg)   BMI 37.98 kg/m  BP Readings from Last 3 Encounters:  04/05/20 129/87  01/06/20 123/86  10/14/19 128/72      Physical Exam Vitals reviewed.  Constitutional:      General: He is not in acute distress.    Appearance: Normal appearance. He is not diaphoretic.  HENT:     Head: Normocephalic and atraumatic.  Eyes:     General: No scleral icterus.  Conjunctiva/sclera: Conjunctivae normal.  Cardiovascular:     Rate and Rhythm: Normal rate and regular rhythm.     Pulses: Normal pulses.     Heart sounds: Normal heart sounds.  Pulmonary:     Effort: Pulmonary effort is normal. No respiratory distress.     Breath sounds: Normal breath sounds. No wheezing.  Abdominal:     General: There is no distension.     Palpations: Abdomen is soft.     Tenderness: There is no abdominal tenderness. There is no right CVA tenderness or left CVA tenderness.  Musculoskeletal:     Right lower leg: No edema.     Left lower leg: No edema.  Skin:    General: Skin is warm and dry.     Findings: No rash.  Neurological:     Mental Status: He is alert  and oriented to person, place, and time. Mental status is at baseline.  Psychiatric:        Mood and Affect: Mood normal.        Behavior: Behavior normal.    IPSS Questionnaire (AUA-7): Over the past month.   1)  How often have you had a sensation of not emptying your bladder completely after you finish urinating?  5 - Almost always  2)  How often have you had to urinate again less than two hours after you finished urinating? 1 - Less than 1 time in 5  3)  How often have you found you stopped and started again several times when you urinated?  2 - Less than half the time  4) How difficult have you found it to postpone urination?  3 - About half the time  5) How often have you had a weak urinary stream?  3 - About half the time  6) How often have you had to push or strain to begin urination?  0 - Not at all  7) How many times did you most typically get up to urinate from the time you went to bed until the time you got up in the morning?  2 - 2 times  Total score:  0-7 mildly symptomatic   8-19 moderately symptomatic   20-35 severely symptomatic     Results for orders placed or performed in visit on 04/05/20  POCT glycosylated hemoglobin (Hb A1C)  Result Value Ref Range   Hemoglobin A1C 6.2 (A) 4.0 - 5.6 %   Est. average glucose Bld gHb Est-mCnc 131   POCT UA - Microalbumin  Result Value Ref Range   Microalbumin Ur, POC 50 mg/L      Assessment & Plan:    Problem List Items Addressed This Visit      Endocrine   Type 2 diabetes mellitus with other specified complication ([place]) - Primary    Well controlled Continue current medications UTD on eye exam, foot exam Pneumovax today Urine microalbumin Not on statin Discussed diet and exercise F/u in 6 months       Relevant Medications   metFORMIN (GLUCOPHAGE-XR) 500 MG 24 hr tablet   Other Relevant Orders   POCT glycosylated hemoglobin (Hb A1C) (Completed)   POCT UA - Microalbumin (Completed)   Pneumococcal polysaccharide  vaccine 23-valent greater than or equal to 2yo subcutaneous/IM (Completed)     Genitourinary   Benign prostatic hyperplasia with post-void dribbling    New problem Track IPSS scores Check PSA Start Flomax 0.4mg  daily  F/u in 3 months      Relevant Medications   tamsulosin (  FLOMAX) 0.4 MG CAPS capsule   Other Relevant Orders   Basic Metabolic Panel (BMET)     Other   Hyperlipidemia (Chronic)    Refuses statin therapy States he will never take one      Morbid obesity ([place])    Discussed importance of healthy weight management Discussed diet and exercise       Relevant Medications   metFORMIN (GLUCOPHAGE-XR) 500 MG 24 hr tablet   Elevated BP without diagnosis of hypertension    Elevated today - suspects this is related to lack of sleep and stress Better on manual recheck          Return in about 3 months (around 07/05/2020) for chronic disease f/u.   The entirety of the information documented in the History of Present Illness, Review of Systems and Physical Exam were personally obtained by me. Portions of this information were initially documented by Lynford Humphrey, CMA and reviewed by me for thoroughness and accuracy.    Taheerah Guldin, Dionne Bucy, MD MPH [facility]

## 2020-04-06 ENCOUNTER — Telehealth: Payer: Self-pay

## 2020-04-06 LAB — BASIC METABOLIC PANEL
BUN/Creatinine Ratio: 19 (ref 9–20)
BUN: 16 mg/dL (ref 6–24)
CO2: 23 mmol/L (ref 20–29)
Calcium: 9.2 mg/dL (ref 8.7–10.2)
Chloride: 102 mmol/L (ref 96–106)
Creatinine, Ser: 0.83 mg/dL (ref 0.76–1.27)
GFR calc Af Amer: 120 mL/min/{1.73_m2} (ref >59)
GFR calc non Af Amer: 104 mL/min/{1.73_m2} (ref >59)
Glucose: 122 mg/dL — ABNORMAL HIGH (ref 65–99)
Potassium: 4.1 mmol/L (ref 3.5–5.2)
Sodium: 141 mmol/L (ref 134–144)

## 2020-04-06 NOTE — Telephone Encounter (Signed)
LMTCB, PEC may give results. 

## 2020-04-06 NOTE — Telephone Encounter (Signed)
-----   Message from Virginia Crews, MD sent at 04/06/2020 10:09 AM EDT ----- Normal labs

## 2020-04-07 NOTE — Telephone Encounter (Signed)
Seen by patient Ethan Perez on 04/06/2020 10:39 AM EDT

## 2020-06-15 ENCOUNTER — Telehealth: Payer: Self-pay

## 2020-06-15 NOTE — Telephone Encounter (Signed)
Have you seen and reviewed over paperwork . KW

## 2020-06-15 NOTE — Telephone Encounter (Signed)
Copied from [place] [id number]. Topic: General - Inquiry >> Jun 15, 2020 10:05 AM Ethan Perez wrote: Reason for CRM: pt called in and stated that pt faxed Perez medical release form yesterday around 5pm.  He stated he has class this afternoon and needs this form asap.  Pt stated it can be faxed to the number on the cover sheet.  He did not have fax number with him.  Or if it is easier he can come by and pick it up.   Best number [phone] Or [phone]

## 2020-06-16 NOTE — Telephone Encounter (Signed)
Form for [facility] was completed and faxed 06/15/2020. Called to advised. No answer. LM. Please advise pt if he returns call. Thanks TNP

## 2020-07-07 ENCOUNTER — Other Ambulatory Visit: Payer: Self-pay

## 2020-07-07 ENCOUNTER — Ambulatory Visit (INDEPENDENT_AMBULATORY_CARE_PROVIDER_SITE_OTHER): Payer: 59 | Admitting: Dermatology

## 2020-07-07 ENCOUNTER — Encounter: Payer: Self-pay | Admitting: Dermatology

## 2020-07-07 DIAGNOSIS — L720 Epidermal cyst: Secondary | ICD-10-CM | POA: Diagnosis not present

## 2020-07-07 DIAGNOSIS — Z1283 Encounter for screening for malignant neoplasm of skin: Secondary | ICD-10-CM | POA: Diagnosis not present

## 2020-07-07 DIAGNOSIS — D229 Melanocytic nevi, unspecified: Secondary | ICD-10-CM

## 2020-07-07 DIAGNOSIS — L918 Other hypertrophic disorders of the skin: Secondary | ICD-10-CM

## 2020-07-07 DIAGNOSIS — Z86006 Personal history of melanoma in-situ: Secondary | ICD-10-CM | POA: Diagnosis not present

## 2020-07-07 DIAGNOSIS — L820 Inflamed seborrheic keratosis: Secondary | ICD-10-CM | POA: Diagnosis not present

## 2020-07-07 DIAGNOSIS — L304 Erythema intertrigo: Secondary | ICD-10-CM

## 2020-07-07 DIAGNOSIS — L814 Other melanin hyperpigmentation: Secondary | ICD-10-CM

## 2020-07-07 DIAGNOSIS — Z86018 Personal history of other benign neoplasm: Secondary | ICD-10-CM

## 2020-07-07 DIAGNOSIS — L821 Other seborrheic keratosis: Secondary | ICD-10-CM

## 2020-07-07 DIAGNOSIS — L578 Other skin changes due to chronic exposure to nonionizing radiation: Secondary | ICD-10-CM

## 2020-07-07 DIAGNOSIS — D1800 Hemangioma unspecified site: Secondary | ICD-10-CM

## 2020-07-07 MED ORDER — KETOCONAZOLE 2 % EX CREA: 1.0000 "application " | TOPICAL_CREAM | Freq: Two times a day (BID) | CUTANEOUS | 2 refills | Status: AC

## 2020-07-07 NOTE — Progress Notes (Signed)
Follow-Up Visit   Subjective  Ethan Perez is a 49 y.o. male who presents for the following: Annual Exam (Total body skin exam, hx of Melanoma IS R mid back paraspinal sup medial, hx of Dysplastic nevi), check brown spots (L ear, forehead, new ~46m), and lump (chest, 69m).  The following portions of the chart were reviewed this encounter and updated as appropriate:  Tobacco  Allergies  Meds  Problems  Med Hx  Surg Hx  Fam Hx     Review of Systems:  No other skin or systemic complaints except as noted in HPI or Assessment and Plan.  Objective  Well appearing patient in no apparent distress; mood and affect are within normal limits.  A full examination was performed including scalp, head, eyes, ears, nose, lips, neck, chest, axillae, abdomen, back, buttocks, bilateral upper extremities, bilateral lower extremities, hands, feet, fingers, toes, fingernails, and toenails. All findings within normal limits unless otherwise noted below.  Objective  Right mid back paraspinal sup medial: Scar clear to visual exam and palpation, no lymphadenopathy  Objective  R xyphoid: 1.0 x 0.5cm cystic pap  Objective  Right mid back paraspinal inferior: Scar with no evidence of recurrence.   Objective  groin: Hyperpigmented patch scrotum  Objective  Right pretibial x 1: Erythematous keratotic or waxy stuck-on papule or plaque.    Assessment & Plan    Lentigines - Scattered tan macules - Discussed due to sun exposure - Benign, observe - Call for any changes  Seborrheic Keratoses - Stuck-on, waxy, tan-brown papules and plaques  - Discussed benign etiology and prognosis. - Observe - Call for any changes  Melanocytic Nevi - Tan-brown and/or pink-flesh-colored symmetric macules and papules - Benign appearing on exam today - Observation - Call clinic for new or changing moles - Recommend daily use of broad spectrum spf 30+ sunscreen to sun-exposed areas.   Hemangiomas - Red  papules - Discussed benign nature - Observe - Call for any changes  Actinic Damage - diffuse scaly erythematous macules with underlying dyspigmentation - Recommend daily broad spectrum sunscreen SPF 30+ to sun-exposed areas, reapply every 2 hours as needed.  - Call for new or changing lesions.  Skin cancer screening performed today.  Acrochordons (Skin Tags) - Fleshy, skin-colored pedunculated papules - Benign appearing.  - Observe. - If desired, they can be removed with an in office procedure that is not covered by insurance. - Please call the clinic if you notice any new or changing lesions. - Codes given to patient for him to check with his insurance to see if removal is covered.    Hx of melanoma in situ Right mid back paraspinal sup medial  Clear. Observe for recurrence. Call clinic for new or changing lesions.  Recommend regular skin exams, daily broad-spectrum spf 30+ sunscreen use, and photoprotection.     Epidermal cyst R xyphoid  Benign, observe  Discussed excising  History of dysplastic nevus Right mid back paraspinal inferior  Clear, observe for changes   Intertrigo groin  Start Ketoconazole 2% cr qhs x 2wks prn flares  If doesn't improve with Ketoconazole will send in the Skin Medicinal Intertrigo mix  ketoconazole (NIZORAL) 2 % cream - groin  Inflamed seborrheic keratosis Right pretibial x 1  Destruction of lesion - Right pretibial x 1 Complexity: simple   Destruction method: cryotherapy   Informed consent: discussed and consent obtained   Timeout:  patient name, date of birth, surgical site, and procedure verified Lesion destroyed using liquid  nitrogen: Yes   Region frozen until ice ball extended beyond lesion: Yes   Outcome: patient tolerated procedure well with no complications   Post-procedure details: wound care instructions given    Skin cancer screening  Return in about 4 months (around 11/07/2020) for TBSE hx of Melanoma IS,  Dysplastic Nevi.  I, [name], RMA, am acting as scribe for Sarina Ser, MD .  Documentation: I have reviewed the above documentation for accuracy and completeness, and I agree with the above.  Sarina Ser, MD

## 2020-07-07 NOTE — Patient Instructions (Addendum)
Codes for Skin tags to give to insurance company to see if they will cover L91.8 [id number]

## 2020-07-10 ENCOUNTER — Encounter: Payer: Self-pay | Admitting: Dermatology

## 2020-07-13 ENCOUNTER — Ambulatory Visit: Payer: Self-pay | Admitting: Family Medicine

## 2020-07-18 ENCOUNTER — Ambulatory Visit: Payer: 59 | Admitting: Family Medicine

## 2020-08-23 ENCOUNTER — Ambulatory Visit (INDEPENDENT_AMBULATORY_CARE_PROVIDER_SITE_OTHER): Payer: 59 | Admitting: Family Medicine

## 2020-08-23 ENCOUNTER — Other Ambulatory Visit: Payer: Self-pay

## 2020-08-23 ENCOUNTER — Encounter: Payer: Self-pay | Admitting: Family Medicine

## 2020-08-23 VITALS — BP 128/84 | HR 94 | Temp 97.8°F | Wt 304.0 lb

## 2020-08-23 DIAGNOSIS — E7849 Other hyperlipidemia: Secondary | ICD-10-CM

## 2020-08-23 DIAGNOSIS — Z23 Encounter for immunization: Secondary | ICD-10-CM

## 2020-08-23 DIAGNOSIS — N401 Enlarged prostate with lower urinary tract symptoms: Secondary | ICD-10-CM | POA: Diagnosis not present

## 2020-08-23 DIAGNOSIS — N3943 Post-void dribbling: Secondary | ICD-10-CM

## 2020-08-23 DIAGNOSIS — R030 Elevated blood-pressure reading, without diagnosis of hypertension: Secondary | ICD-10-CM

## 2020-08-23 DIAGNOSIS — E1169 Type 2 diabetes mellitus with other specified complication: Secondary | ICD-10-CM

## 2020-08-23 LAB — POCT GLYCOSYLATED HEMOGLOBIN (HGB A1C): Hemoglobin A1C: 7 % — AB (ref 4.0–5.6)

## 2020-08-23 MED ORDER — TAMSULOSIN HCL 0.4 MG PO CAPS: 0.8000 mg | ORAL_CAPSULE | Freq: Every day | ORAL | 3 refills | Status: DC

## 2020-08-23 NOTE — Patient Instructions (Signed)

## 2020-08-23 NOTE — Assessment & Plan Note (Addendum)
BP normal today.   Pt stopped Lisinopril secondary side effects.  Will continue to monitor.

## 2020-08-23 NOTE — Assessment & Plan Note (Addendum)
Elevated from 6.2 04/05/2020 to 7.0 today. Uncontrolled, hyperglycemia Goal K2H <7 Complicated by elevated BP, morbid obesity, and HLD No changes in medicines at this time, continue to work on lifestyle changes.   UTD on eye exam. ([facility] in [place]) Will call to get records.  Pt states he stopped Lisinopril secondary to side effects. Will continue to monitor. Not on a statin. Recheck in 3 months.

## 2020-08-23 NOTE — Progress Notes (Signed)
I,Laura E Walsh,acting as a scribe for Lavon Paganini, MD.,have documented all relevant documentation on the behalf of Lavon Paganini, MD,as directed by  Lavon Paganini, MD while in the presence of Lavon Paganini, MD.    Established patient visit   Patient: Ethan Perez   DOB: 1971/03/29   49 y.o. Male  MRN: [id number] Visit Date: 08/23/2020  Today's healthcare provider: Lavon Paganini, MD   Chief Complaint  Patient presents with  . Diabetes  . Hyperlipidemia  . Hypertension   Subjective    HPI   Diabetes Mellitus Type II, follow-up  Lab Results  Component Value Date   HGBA1C 7.0 (A) 08/23/2020   HGBA1C 6.2 (A) 04/05/2020   HGBA1C 6.1 12/08/2019   Last seen for diabetes 4 months ago.  Management since then includes continuing the same treatment. He reports excellent compliance with treatment. He is not having side effects.   Home blood sugar records: fasting range: 100's  Episodes of hypoglycemia? Yes  Only occasionally   Current insulin regiment:  None Most Recent Eye Exam: UTD  ---------------------------------------------------------------------------------------------------  Lipid/Cholesterol, follow-up  Last Lipid Panel: Lab Results  Component Value Date   CHOL 182 12/08/2019   LDLCALC 116 12/08/2019   HDL 44 12/08/2019   TRIG 125 12/08/2019    He was last seen for this 8 months ago.  Management since that visit includes no changes.  He reports excellent compliance with treatment. He is not having side effects.   Symptoms: No appetite changes No foot ulcerations  No chest pain No chest pressure/discomfort  No dyspnea No orthopnea  No fatigue No lower extremity edema  No palpitations No paroxysmal nocturnal dyspnea  No nausea No numbness or tingling of extremity  No polydipsia No polyuria  No speech difficulty No syncope   Follow up for Benign prostatic hyperplasia with post-void dribbling  The patient was last  seen for this 4 months ago. Changes made at last visit include starting Flomax 0.4mg  daily.  He reports excellent compliance with treatment. He feels that condition is improved but not to goal. . He is not having side effects.   -----------------------------------------------------------------------------------------     IPSS    Row Name 08/23/20 1616         International Prostate Symptom Score   How often have you had the sensation of not emptying your bladder? Not at All     How often have you had to urinate less than every two hours? Less than 1 in 5 times     How often have you found you stopped and started again several times when you urinated? Less than 1 in 5 times     How often have you found it difficult to postpone urination? About half the time     How often have you had a weak urinary stream? Less than 1 in 5 times     How often have you had to strain to start urination? Not at All     How many times did you typically get up at night to urinate? 2 Times     Total IPSS Score 8       Quality of Life due to urinary symptoms   If you were to spend the rest of your life with your urinary condition just the way it is now how would you feel about that? Mostly Disatisfied              Last metabolic panel Lab Results  Component Value  Date   GLUCOSE 122 (H) 04/05/2020   NA 141 04/05/2020   K 4.1 04/05/2020   BUN 16 04/05/2020   CREATININE 0.83 04/05/2020   GFRNONAA 104 04/05/2020   GFRAA 120 04/05/2020   CALCIUM 9.2 04/05/2020   AST 28 05/06/2018   ALT 30 05/06/2018   The 10-year ASCVD risk score Mikey Bussing DC Jr., et al., 2013) is: 5.4%* (Cholesterol units were assumed)  ---------------------------------------------------------------------------------------------------   Patient Active Problem List   Diagnosis Date Noted  . Benign prostatic hyperplasia with post-void dribbling 04/05/2020  . Acute bursitis of right shoulder 01/06/2020  . Gastroesophageal  reflux disease 01/06/2020  . History of melanoma 01/06/2020  . Reactive airway disease 01/06/2020  . History of sinus cancer 01/06/2020  . Preventative health care 05/06/2018  . Elevated BP without diagnosis of hypertension 12/24/2016  . Psoriasis 06/21/2016  . Migraines   . Type 2 diabetes mellitus with other specified complication ([place])   . Morbid obesity ([place])   . Hyperlipidemia    Past Medical History:  Diagnosis Date  . Allergy   . Asthma   . Cancer (HCC)    sinus cancer 09/2018  . Diabetes mellitus without complication ([place])   . Dysplastic nevus 04/07/2019   right mid back paraspinal inferior, Excised 07/21/2019, right mid back paraspinal superior lateral excised 04/14/2019, left of midline suprapubic inf to waistline  . History of concussion   . Hyperlipidemia   . IFG (impaired fasting glucose)   . 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 Right mid back paraspinal, sup. medial  . Migraines   . Numbness and tingling of left thumb   . Obesity   . Ruptured disc, cervical 2015   Social History   Tobacco Use  . Smoking status: Never Smoker  . Smokeless tobacco: Never Used  Vaping Use  . Vaping Use: Never used  Substance Use Topics  . Alcohol use: No  . Drug use: No   Allergies  Allergen Reactions  . Cat Hair Extract Shortness Of Breath    Rabbits, [place] pigs  . Other Swelling    [place] nuts  . Prochlorperazine   . Tramadol Other (See Comments)    Sleep for 24 hr.  . Compazine [Prochlorperazine Edisylate] Other (See Comments)    Causes involuntary muscle movements.     Medications: Outpatient Medications Prior to Visit  Medication Sig  . albuterol (PROVENTIL HFA;VENTOLIN HFA) 108 (90 Base) MCG/ACT inhaler Inhale 1-2 puffs into the lungs every 6 (six) hours as needed for wheezing or shortness of breath.  Marland Kitchen aspirin-acetaminophen-caffeine (EXCEDRIN MIGRAINE) 250-250-65 MG per tablet Take 2 tablets by mouth every 6 (six) hours as needed for  headache.  . metFORMIN (GLUCOPHAGE-XR) 500 MG 24 hr tablet Take 2 tablets (1,000 mg total) by mouth 2 (two) times daily.  . [DISCONTINUED] tamsulosin (FLOMAX) 0.4 MG CAPS capsule Take 1 capsule (0.4 mg total) by mouth daily.  . [DISCONTINUED] Ascorbic Acid (VITAMIN C) 100 MG tablet Take 100 mg by mouth daily.  . [DISCONTINUED] lisinopril (ZESTRIL) 5 MG tablet Take 1 tablet (5 mg total) by mouth daily.  . [DISCONTINUED] mupirocin ointment (BACTROBAN) 2 % Apply 1 application topically daily. (Patient not taking: Reported on 04/05/2020)  . [DISCONTINUED] triamcinolone cream (KENALOG) 0.5 % Apply 1 application topically 3 (three) times daily. If needed on rash; too strong for face, underarms, groin  . [DISCONTINUED] Vitamin D, Ergocalciferol, (DRISDOL) 1.25 MG (50000 UNIT) CAPS capsule Take 50,000 Units by mouth once a week.  No facility-administered medications prior to visit.    Review of Systems  Constitutional: Negative.   Respiratory: Positive for shortness of breath. Negative for apnea, cough, choking, chest tightness, wheezing and stridor.   Cardiovascular: Negative.   Gastrointestinal: Negative.   Endocrine: Negative.   Skin: Negative for wound.  Neurological: Negative for dizziness, light-headedness and headaches.      Objective    BP 128/84   Pulse 94   Temp 97.8 F (36.6 C) (Oral)   Wt (!) 304 lb (137.9 kg)   BMI 37.00 kg/m    Physical Exam Constitutional:      Appearance: Normal appearance.  Eyes:     Conjunctiva/sclera: Conjunctivae normal.  Cardiovascular:     Rate and Rhythm: Normal rate and regular rhythm.     Pulses: Normal pulses.     Heart sounds: Normal heart sounds. No murmur heard.   Pulmonary:     Effort: Pulmonary effort is normal. No respiratory distress.     Breath sounds: No wheezing.  Abdominal:     General: There is no distension.     Palpations: Abdomen is soft.     Tenderness: There is no abdominal tenderness.  Musculoskeletal:     Right  lower leg: No edema.     Left lower leg: No edema.  Skin:    General: Skin is warm and dry.  Neurological:     Mental Status: He is alert and oriented to person, place, and time. Mental status is at baseline.  Psychiatric:        Mood and Affect: Mood normal.        Behavior: Behavior normal.       Results for orders placed or performed in visit on 08/23/20  POCT glycosylated hemoglobin (Hb A1C)  Result Value Ref Range   Hemoglobin A1C 7.0 (A) 4.0 - 5.6 %    Assessment & Plan     Problem List Items Addressed This Visit      Endocrine   Type 2 diabetes mellitus with other specified complication ([place]) - Primary    Elevated from 6.2 04/05/2020 to 7.0 today. No changes in medicines at this time, continue to work on lifestyle changes.   UTD on eye exam. ([facility] in [place]) Will call to get records.  Pt states he stopped Lisinopril secondary to side effects. Will continue to monitor. Not on a statin. Recheck in 3 months.        Relevant Orders   POCT glycosylated hemoglobin (Hb A1C) (Completed)     Genitourinary   Benign prostatic hyperplasia with post-void dribbling    Pt reports some improvement will Flomax but not to goal.  Increase Flomax to 0.8mg  daily. Continue to track IPSS scores.      Relevant Medications   tamsulosin (FLOMAX) 0.4 MG CAPS capsule     Other   Hyperlipidemia (Chronic)    Refuses statin therapy.  LDL goal is less then 70.       Elevated BP without diagnosis of hypertension    BP normal today.   Pt stopped Lisinopril secondary side effects.  Will continue to monitor.        Other Visit Diagnoses    Need for influenza vaccination       Relevant Orders   Flu Vaccine QUAD 6+ mos PF IM (Fluarix Quad PF)     Counseled about COVID19 vaccine safety and efficacy and encouraged patient to get it when available.   Return in about 3 months (  around 11/22/2020) for Diabetes .      I, Lavon Paganini, MD, have reviewed all  documentation for this visit. The documentation on 08/24/20 for the exam, diagnosis, procedures, and orders are all accurate and complete.   Fredick Schlosser, Dionne Bucy, MD, MPH [facility]

## 2020-08-23 NOTE — Assessment & Plan Note (Signed)
Refuses statin therapy.  LDL goal is less then 70.

## 2020-08-23 NOTE — Assessment & Plan Note (Signed)
Pt reports some improvement will Flomax but not to goal.  Increase Flomax to 0.8mg  daily. Continue to track IPSS scores.

## 2020-08-24 NOTE — Assessment & Plan Note (Signed)
BMI 37 with T2DM, HLD, GERD Discussed importance of healthy weight management Discussed diet and exercise

## 2020-08-25 IMAGING — CT CT MAXILLOFACIAL W/ CM
3 series · 15 of 47 positions shown, 18 images · IV contrast (omnipaque)
Comparison: CT head reported separately. CT maxillofacial
09/19/2018.

CLINICAL DATA: History of sinonasal glomangioma pericytoma of the
right skull base. Status post surgical removal in 5002. Now with
headache.

EXAM:
CT MAXILLOFACIAL WITH CONTRAST
TECHNIQUE: Multidetector CT imaging of the maxillofacial structures was
performed with intravenous contrast. Multiplanar CT image
reconstructions were also generated.
CONTRAST:  75mL OMNIPAQUE IOHEXOL 300 MG/ML  SOLN

[Series 2: max soft · axial · 0.35mm/px · z∈[-247,-81]mm · 9 of 97 slices shown, 12 images]
[im 7/97  brain]
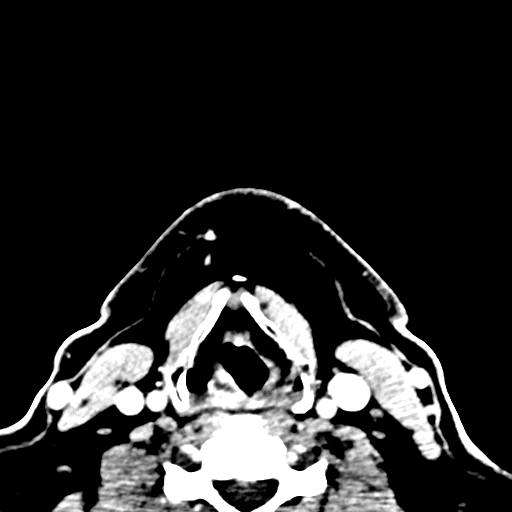
[im 7/97  bone]
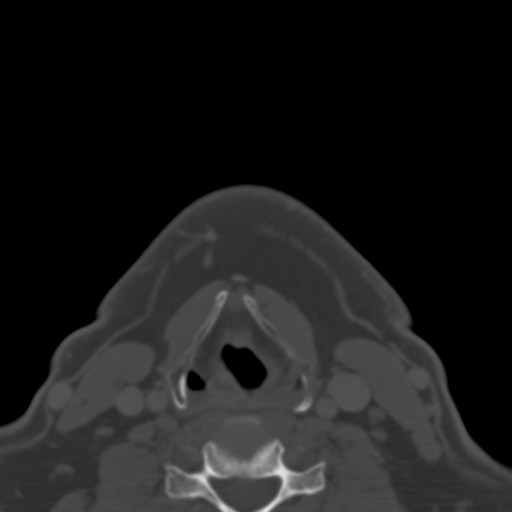
[im 17/97  bone]
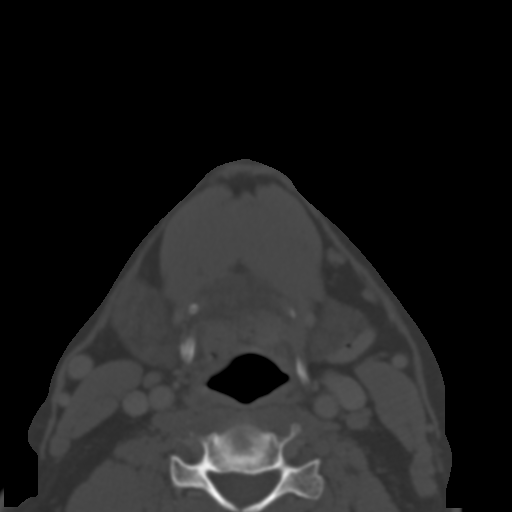
[im 27/97  bone]
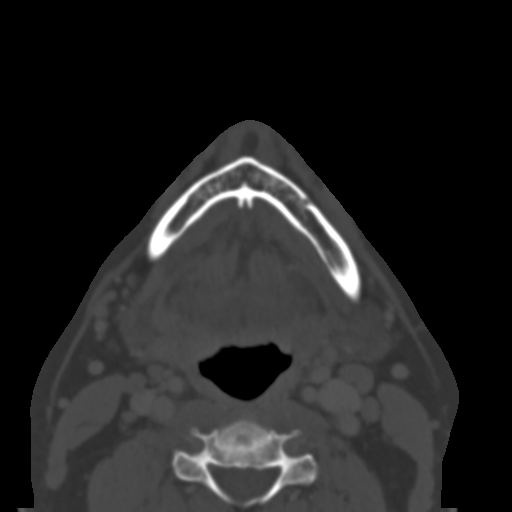
[im 37/97  bone]
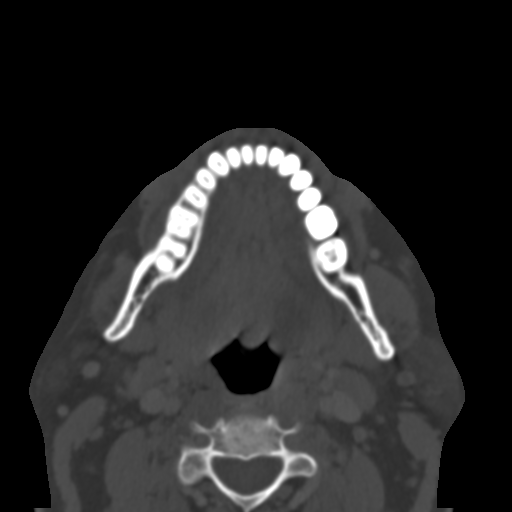
[im 50/97  brain]
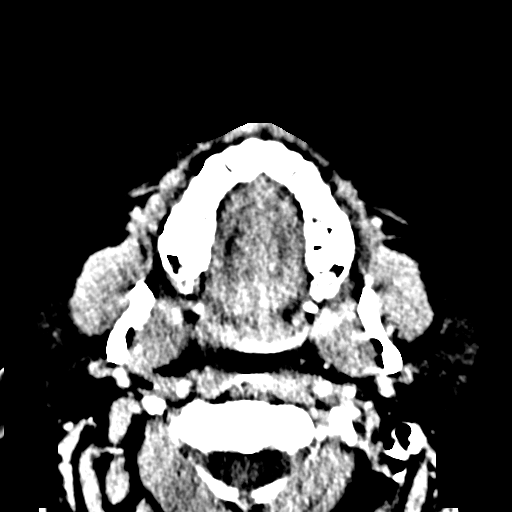
[im 50/97  bone]
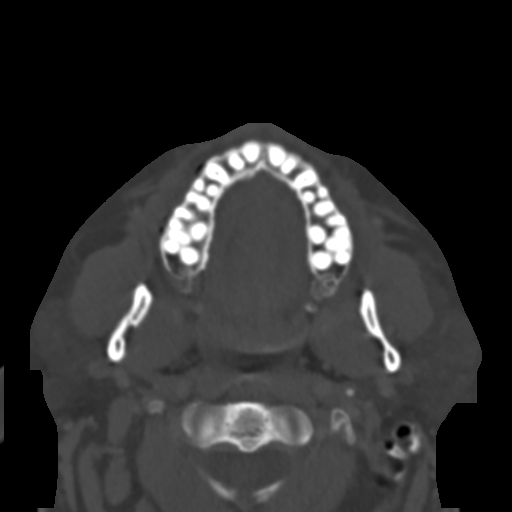
[im 60/97  bone]
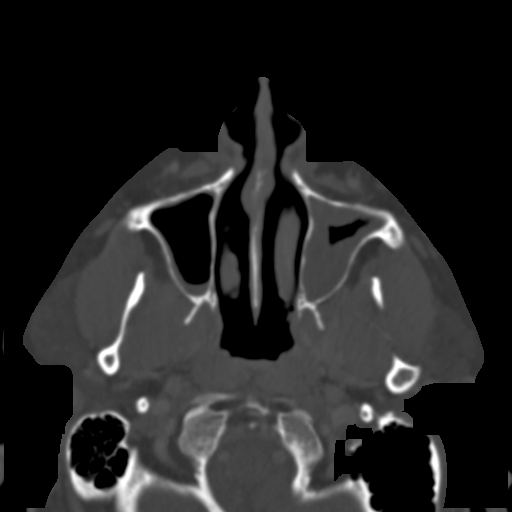
[im 70/97  bone]
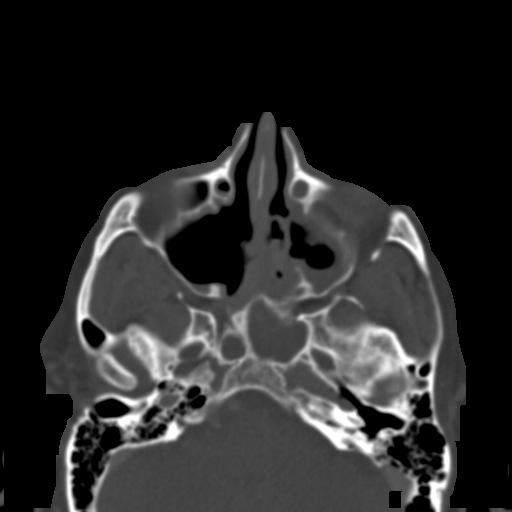
[im 80/97  bone]
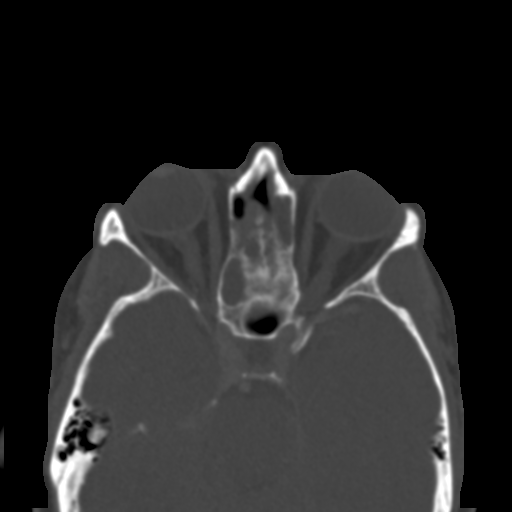
[im 90/97  brain]
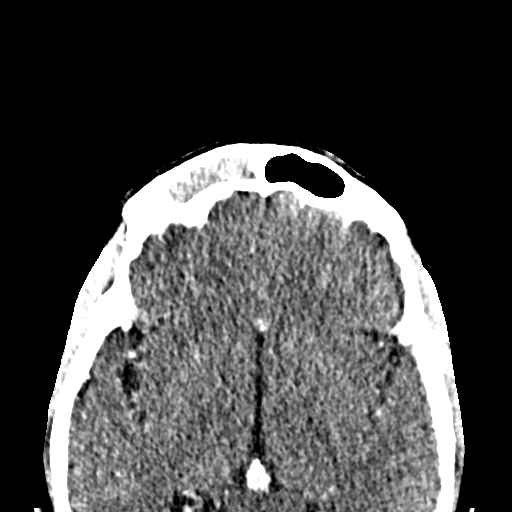
[im 90/97  bone]
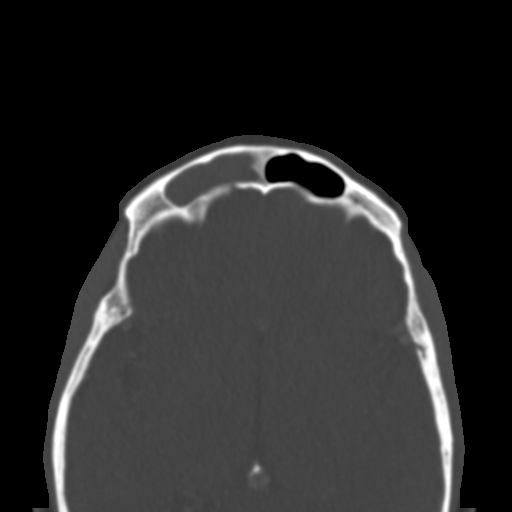

[Series 4: coronal soft · coronal · 0.36mm/px · 3 of 75 slices shown]
[im 25/75  bone]
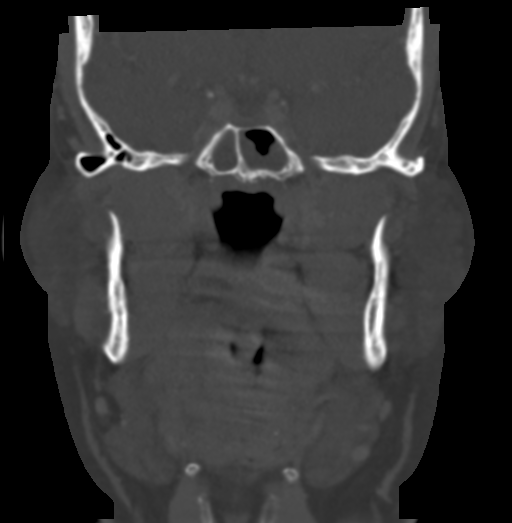
[im 33/75  bone]
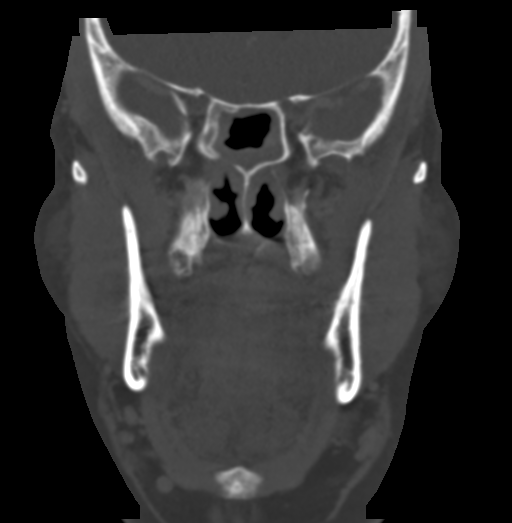
[im 42/75  bone]
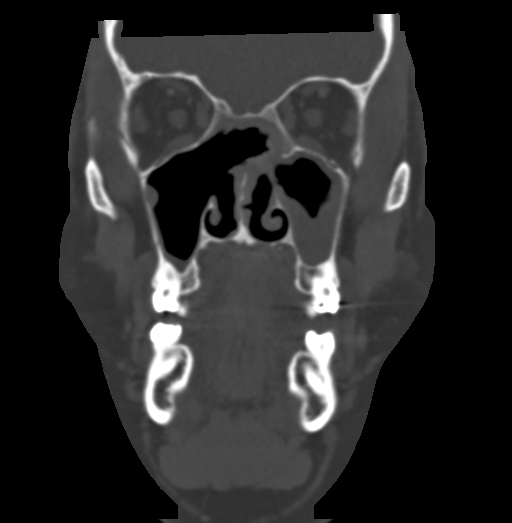

[Series 6: sagittal soft · sagittal · 0.29mm/px · 3 of 93 slices shown]
[im 31/93  bone]
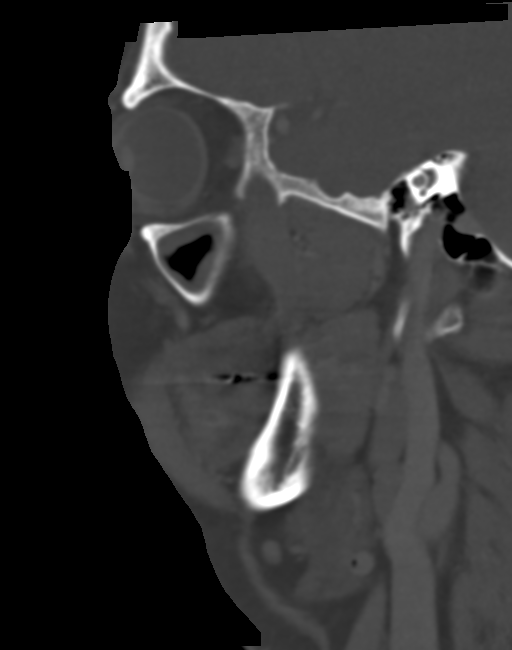
[im 47/93  bone]
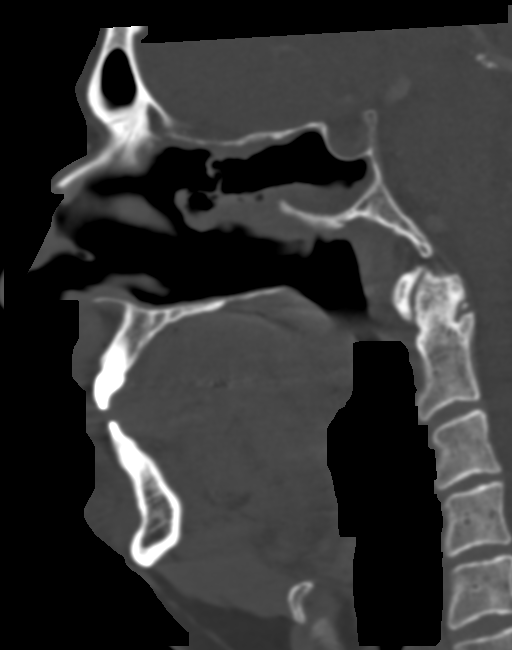
[im 62/93  bone]
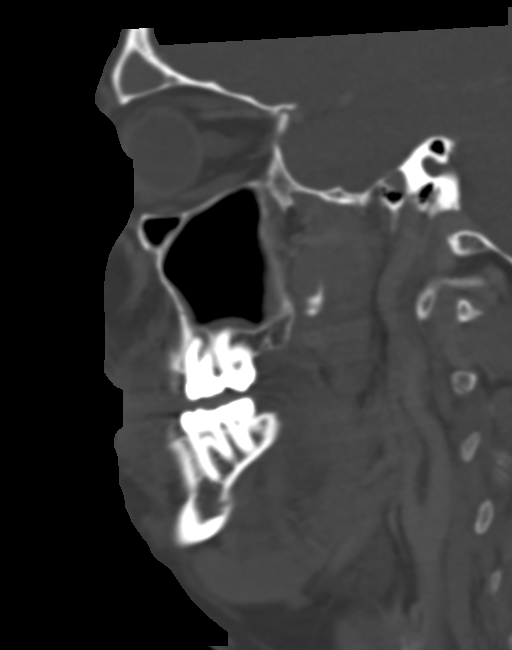

[15 of 47 positions shown; findings below may reference images not displayed]

FINDINGS: Osseous: The patient is status post extensive sinus exenteration for
treatment of the above-named neoplasm. There is dehiscence of the
RIGHT cribriform plate and RIGHT lamina papyracea, which was present
preoperatively. The other osseous changes appear related to
BILATERAL sphenoid ethmoidectomies, BILATERAL maxillary
antrectomies, and frontal antrostomies. No fracture is seen.

Orbits: Negative. No traumatic or inflammatory finding.

Sinuses: Sinus surgery as described. Mucosal thickening is
moderately extensive in the LEFT maxillary sinus. Layering fluid is
seen in both maxillary sinuses, greater on the LEFT. BILATERAL
ethmoidectomies have been performed with mucosal thickening greater
on the LEFT. Fluid and/or soft tissue accumulation is greater in the
RIGHT division sphenoid. There is complete opacification of the
RIGHT frontal sinus, although no definite obstructing mass is seen
at the frontal sinus ostium. No definite residual nasal cavity mass
is observed.

Soft tissues: Negative.

Limited intracranial: Reported separately.
IMPRESSION: 1. Status post extensive sinus exenteration for treatment of
sinonasal glomangioma pericytoma. No definite residual nasal cavity
mass is observed, although residual mucosal disease is not excluded.
2. There is dehiscence of the RIGHT cribriform plate and RIGHT
lamina papyracea, which was present preoperatively.
3. There is complete opacification of the RIGHT frontal sinus,
although no definite obstructing mass is seen at the frontal sinus
ostium. Correlate clinically for acute frontal sinusitis.
4. Layering fluid and mucosal thickening throughout the remaining
sinuses, as described above.

## 2020-09-14 ENCOUNTER — Telehealth: Payer: Self-pay

## 2020-09-14 MED ORDER — ONETOUCH ULTRA VI STRP: ORAL_STRIP | 1 refills | Status: AC

## 2020-09-14 NOTE — Telephone Encounter (Signed)
Copied from [place] [id number]. Topic: Quick Communication - See Telephone Encounter >> Sep 13, 2020  4:41 PM Loma [place] wrote:  CRM for notification. See Telephone encounter for: 09/13/20.Pls FU with Pt said discussed with Dr B last visit about increasing test strips to 100 but test strips not on file pt states he uses One Touch Ultra test  FU One Touch Ultra is not able to be written send to [facility], [place] [address]  Phone:  [phone] Fax:  [phone]

## 2020-09-14 NOTE — Telephone Encounter (Signed)
Test strips sent to [facility] in [place].   Thanks,   -Mickel Baas

## 2020-10-11 ENCOUNTER — Other Ambulatory Visit: Payer: Self-pay | Admitting: Family Medicine

## 2020-10-11 NOTE — Telephone Encounter (Signed)
Medication Refill - Medication: glucose blood (ONETOUCH ULTRA) test strip [[id number]]    Preferred Pharmacy (with phone number or street name):  [id number] - [address], [address]  [place] [address]  Phone: [phone] Fax: [phone]  Hours: Not open 24 hours  Pt states medication is supposed to increase to 50.  Agent: Please be advised that RX refills may take up to 3 business days. We ask that you follow-up with your pharmacy.

## 2020-10-13 ENCOUNTER — Telehealth: Payer: Self-pay | Admitting: Family Medicine

## 2020-10-13 NOTE — Telephone Encounter (Signed)
Pt called in and stated he had a message for Dr. Jacinto Reap when I asked what the message was he yelled "YOUR'E FIRED " / Pt stated he was waiting for test strips and that this is malpractice because he hasnt been able to check his blood sugar for a week / I advised pt of request being denied due to being made too early and that [facility] should have a refill on file for him/ he stated that [facility] told him they didn't have an rx and they sent a request as well/ I called [facility] and the person I spoke with said they had an old refill on file that they could get ready for him and it was for a 3o day supply of 60 strips/ issue solved  Pt was also upset and stated that Dr. Luanna Cole to him about covid and that he would be looking for another provider . He didn't state what he was told in reference to covid/ please advise

## 2020-10-14 NOTE — Telephone Encounter (Signed)
Please remove me as being listed as his PCP and cancel future appointments if I am fired. Also want you to have seen this for transparency Sarah.

## 2020-11-09 ENCOUNTER — Encounter: Payer: Self-pay | Admitting: Dermatology

## 2020-11-09 ENCOUNTER — Other Ambulatory Visit: Payer: Self-pay

## 2020-11-09 ENCOUNTER — Ambulatory Visit: Payer: 59 | Admitting: Dermatology

## 2020-11-09 DIAGNOSIS — L739 Follicular disorder, unspecified: Secondary | ICD-10-CM

## 2020-11-09 DIAGNOSIS — L578 Other skin changes due to chronic exposure to nonionizing radiation: Secondary | ICD-10-CM

## 2020-11-09 DIAGNOSIS — D224 Melanocytic nevi of scalp and neck: Secondary | ICD-10-CM

## 2020-11-09 DIAGNOSIS — L02213 Cutaneous abscess of chest wall: Secondary | ICD-10-CM | POA: Diagnosis not present

## 2020-11-09 DIAGNOSIS — D1800 Hemangioma unspecified site: Secondary | ICD-10-CM

## 2020-11-09 DIAGNOSIS — L820 Inflamed seborrheic keratosis: Secondary | ICD-10-CM

## 2020-11-09 DIAGNOSIS — L719 Rosacea, unspecified: Secondary | ICD-10-CM

## 2020-11-09 DIAGNOSIS — L821 Other seborrheic keratosis: Secondary | ICD-10-CM

## 2020-11-09 DIAGNOSIS — Z86006 Personal history of melanoma in-situ: Secondary | ICD-10-CM

## 2020-11-09 DIAGNOSIS — Z1283 Encounter for screening for malignant neoplasm of skin: Secondary | ICD-10-CM | POA: Diagnosis not present

## 2020-11-09 DIAGNOSIS — D229 Melanocytic nevi, unspecified: Secondary | ICD-10-CM

## 2020-11-09 DIAGNOSIS — L700 Acne vulgaris: Secondary | ICD-10-CM

## 2020-11-09 DIAGNOSIS — L819 Disorder of pigmentation, unspecified: Secondary | ICD-10-CM

## 2020-11-09 DIAGNOSIS — Z86018 Personal history of other benign neoplasm: Secondary | ICD-10-CM

## 2020-11-09 DIAGNOSIS — L918 Other hypertrophic disorders of the skin: Secondary | ICD-10-CM

## 2020-11-09 DIAGNOSIS — L0291 Cutaneous abscess, unspecified: Secondary | ICD-10-CM

## 2020-11-09 DIAGNOSIS — L814 Other melanin hyperpigmentation: Secondary | ICD-10-CM

## 2020-11-09 MED ORDER — DOXYCYCLINE HYCLATE 50 MG PO CAPS
100.0000 mg | ORAL_CAPSULE | Freq: Every day | ORAL | 2 refills | Status: DC
Start: 2020-11-09 — End: 2021-04-10

## 2020-11-09 MED ORDER — KETOCONAZOLE 2 % EX SHAM: 1.0000 "application " | MEDICATED_SHAMPOO | CUTANEOUS | 4 refills | Status: DC

## 2020-11-09 NOTE — Patient Instructions (Signed)
Doxycycline 50mg  take 2 pills daily with food and drink for 2 weeks, then decrease to 1 pill a day with food and drink

## 2020-11-09 NOTE — Progress Notes (Signed)
Follow-Up Visit   Subjective  Ethan Perez is a 49 y.o. male who presents for the following: mole check (57m f/u, TBSE, hx of Melanoma IS r mid back paraspinal sup medial, hx of dysplastic nevi) and growths (post scalp, ~1wk painful). The patient presents for Total-Body Skin Exam (TBSE) for skin cancer screening and mole check.  The following portions of the chart were reviewed this encounter and updated as appropriate:  Tobacco  Allergies  Meds  Problems  Med Hx  Surg Hx  Fam Hx     Review of Systems:  No other skin or systemic complaints except as noted in HPI or Assessment and Plan.  Objective  Well appearing patient in no apparent distress; mood and affect are within normal limits.  A full examination was performed including scalp, head, eyes, ears, nose, lips, neck, chest, axillae, abdomen, back, buttocks, bilateral upper extremities, bilateral lower extremities, hands, feet, fingers, toes, fingernails, and toenails. All findings within normal limits unless otherwise noted below.  Objective  post scalp: Paps, scarring and alopecia post scalp  Objective  R neck: Depigmented macule  Images      Objective  back: Paps back  Objective  R lat chest: abscess  Objective  Right Forearm  x 3 (3): Erythematous keratotic or waxy stuck-on papule or plaque.   Objective  Right mid back paraspinal, sup. medial: Scar clear to visual exam and palpation   Assessment & Plan    Lentigines - Scattered tan macules - Discussed due to sun exposure - Benign, observe - Call for any changes  Seborrheic Keratoses - Stuck-on, waxy, tan-brown papules and plaques  - Discussed benign etiology and prognosis. - Observe - Call for any changes  Melanocytic Nevi - Tan-brown and/or pink-flesh-colored symmetric macules and papules - Benign appearing on exam today - Observation - Call clinic for new or changing moles - Recommend daily use of broad spectrum spf 30+ sunscreen  to sun-exposed areas.   Hemangiomas - Red papules - Discussed benign nature - Observe - Call for any changes  Actinic Damage - Chronic, secondary to cumulative UV/sun exposure - diffuse scaly erythematous macules with underlying dyspigmentation - Recommend daily broad spectrum sunscreen SPF 30+ to sun-exposed areas, reapply every 2 hours as needed.  - Call for new or changing lesions.  Skin cancer screening performed today.  History of Dysplastic Nevi - No evidence of recurrence today - Recommend regular full body skin exams - Recommend daily broad spectrum sunscreen SPF 30+ to sun-exposed areas, reapply every 2 hours as needed.  - Call if any new or changing lesions are noted between office visits  Acrochordons (Skin Tags) - Fleshy, skin-colored pedunculated papules - Benign appearing.  - Observe. - If desired, they can be removed with an in office procedure that is not covered by insurance. - Please call the clinic if you notice any new or changing lesions.  Folliculitis - Chronic with Scarring. Rosacea.   Bacterial vs Sterile vs Pityrosporum vs combination. post scalp Furunculosis with scarring and alopecia/Rosacea  Start Doxycycline 50mg  2 po qd with food and drink for 2 weeks then decrease to 1 po qd with food and drink Start Ketoconazole 2% shampoo 3x/wk, let sit 5 minutes and rinse out  doxycycline (VIBRAMYCIN) 50 MG capsule - post scalp  ketoconazole (NIZORAL) 2 % shampoo - post scalp  Halo nevus R neck Vs Vitiligo Benign appearing, observe  Acne vulgaris - chronic back Start Doxycycline 50mg  2 po qd with food and drink  for 2 weeks then decrease to 1 po qd with food and drink  Abscess R lat chest Incision and Drainage - R lat chest Location: R lat chest  Informed Consent: Discussed risks (permanent scarring, light or dark discoloration, infection, pain, bleeding, bruising, redness, damage to adjacent structures, and recurrence of the lesion) and benefits  of the procedure, as well as the alternatives.  Informed consent was obtained.  Preparation: The area was prepped with alcohol.  Anesthesia: Lidocaine 1% with epinephrine  Procedure Details: An incision was made overlying the lesion. The lesion drained pus and blood.  A small amount of fluid was drained.    Antibiotic ointment and a sterile pressure dressing were applied. The patient tolerated procedure well.  Total number of lesions drained: 1  Plan: The patient was instructed on post-op care. Recommend OTC analgesia as needed for pain.  Other Related Procedures Aerobic culture  Inflamed seborrheic keratosis (3) Right Forearm  x 3  Destruction of lesion - Right Forearm  x 3 Complexity: simple   Destruction method: cryotherapy   Informed consent: discussed and consent obtained   Timeout:  patient name, date of birth, surgical site, and procedure verified Lesion destroyed using liquid nitrogen: Yes   Region frozen until ice ball extended beyond lesion: Yes   Outcome: patient tolerated procedure well with no complications   Post-procedure details: wound care instructions given    History of melanoma in situ Right mid back paraspinal, sup. medial Clear. No lymphadenopathy. Observe for recurrence. Call clinic for new or changing lesions.  Recommend regular skin exams, daily broad-spectrum spf 30+ sunscreen use, and photoprotection.    Excised 04/14/2019  Return in about 4 months (around 03/09/2021) for TBSE, hx of Melanoma IS, Dysplastic nevi.  I, [name], RMA, am acting as scribe for Sarina Ser, MD .  Documentation: I have reviewed the above documentation for accuracy and completeness, and I agree with the above.  Sarina Ser, MD

## 2020-11-10 ENCOUNTER — Other Ambulatory Visit: Payer: Self-pay | Admitting: Dermatology

## 2020-11-10 ENCOUNTER — Encounter: Payer: Self-pay | Admitting: Family Medicine

## 2020-11-14 ENCOUNTER — Encounter: Payer: Self-pay | Admitting: Dermatology

## 2020-11-14 LAB — AEROBIC CULTURE

## 2020-11-22 ENCOUNTER — Telehealth: Payer: Self-pay

## 2020-11-22 NOTE — Telephone Encounter (Signed)
Patient informed of culture results.  

## 2020-11-22 NOTE — Telephone Encounter (Signed)
-----   Message from Ralene Bathe, MD sent at 11/22/2020  7:20 AM EST ----- No abnormal bacterial growth (chest culture) Continue prescribed treatment as recommended

## 2020-11-24 ENCOUNTER — Ambulatory Visit: Payer: 59 | Admitting: Family Medicine

## 2020-12-23 ENCOUNTER — Other Ambulatory Visit: Payer: Self-pay | Admitting: Family Medicine

## 2020-12-23 DIAGNOSIS — N401 Enlarged prostate with lower urinary tract symptoms: Secondary | ICD-10-CM

## 2020-12-23 DIAGNOSIS — N3943 Post-void dribbling: Secondary | ICD-10-CM

## 2021-03-22 ENCOUNTER — Ambulatory Visit: Payer: 59 | Admitting: Dermatology

## 2021-04-10 ENCOUNTER — Other Ambulatory Visit: Payer: Self-pay | Admitting: Dermatology

## 2021-04-10 DIAGNOSIS — L739 Follicular disorder, unspecified: Secondary | ICD-10-CM

## 2021-07-19 ENCOUNTER — Other Ambulatory Visit: Payer: Self-pay

## 2021-07-19 ENCOUNTER — Ambulatory Visit: Payer: 59 | Admitting: Dermatology

## 2021-07-19 DIAGNOSIS — L821 Other seborrheic keratosis: Secondary | ICD-10-CM

## 2021-07-19 DIAGNOSIS — L720 Epidermal cyst: Secondary | ICD-10-CM

## 2021-07-19 DIAGNOSIS — Z1283 Encounter for screening for malignant neoplasm of skin: Secondary | ICD-10-CM

## 2021-07-19 DIAGNOSIS — L578 Other skin changes due to chronic exposure to nonionizing radiation: Secondary | ICD-10-CM | POA: Diagnosis not present

## 2021-07-19 DIAGNOSIS — L814 Other melanin hyperpigmentation: Secondary | ICD-10-CM | POA: Diagnosis not present

## 2021-07-19 DIAGNOSIS — D1800 Hemangioma unspecified site: Secondary | ICD-10-CM

## 2021-07-19 DIAGNOSIS — D229 Melanocytic nevi, unspecified: Secondary | ICD-10-CM

## 2021-07-19 NOTE — Patient Instructions (Signed)
   Pre-Operative Instructions  You are scheduled for a surgical procedure at [facility]. We recommend you read the following instructions. If you have any questions or concerns, please call the office at [phone].  Shower and wash the entire body with soap and water the day of your surgery paying special attention to cleansing at and around the planned surgery site.  Avoid aspirin or aspirin containing products at least fourteen (14) days prior to your surgical procedure and for at least one week (7 Days) after your surgical procedure. If you take aspirin on a regular basis for heart disease or history of stroke or for any other reason, we may recommend you continue taking aspirin but please notify us if you take this on a regular basis. Aspirin can cause more bleeding to occur during surgery as well as prolonged bleeding and bruising after surgery.   Avoid other nonsteroidal pain medications at least one week prior to surgery and at least one week prior to your surgery. These include medications such as Ibuprofen (Motrin, Advil and Nuprin), Naprosyn, Voltaren, Relafen, etc. If medications are used for therapeutic reasons, please inform us as they can cause increased bleeding or prolonged bleeding during and bruising after surgical procedures.   Please advise us if you are taking any "blood thinner" medications such as Coumadin or Dipyridamole or Plavix or similar medications. These cause increased bleeding and prolonged bleeding during procedures and bruising after surgical procedures. We may have to consider discontinuing these medications briefly prior to and shortly after your surgery if safe to do so.   Please inform us of all medications you are currently taking. All medications that are taken regularly should be taken the day of surgery as you always do. Nevertheless, we need to be informed of what medications you are taking prior to surgery to know whether they will affect the  procedure or cause any complications.   Please inform us of any medication allergies. Also inform us of whether you have allergies to Latex or rubber products or whether you have had any adverse reaction to Lidocaine or Epinephrine.  Please inform us of any prosthetic or artificial body parts such as artificial heart valve, joint replacements, etc., or similar condition that might require preoperative antibiotics.   We recommend avoidance of alcohol at least two weeks prior to surgery and continued avoidance for at least two weeks after surgery.   We recommend discontinuation of tobacco smoking at least two weeks prior to surgery and continued abstinence for at least two weeks after surgery.  Do not plan strenuous exercise, strenuous work or strenuous lifting for approximately four weeks after your surgery.   We request if you are unable to make your scheduled surgical appointment, please call us at least a week in advance or as soon as you are aware of a problem so that we can cancel or reschedule the appointment.   You MAY TAKE TYLENOL (acetaminophen) for pain as it is not a blood thinner.   PLEASE PLAN TO BE IN TOWN FOR TWO WEEKS FOLLOWING SURGERY, THIS IS IMPORTANT SO YOU CAN BE CHECKED FOR DRESSING CHANGES, SUTURE REMOVAL AND TO MONITOR FOR POSSIBLE COMPLICATIONS.  

## 2021-07-19 NOTE — Progress Notes (Signed)
   Follow-Up Visit   Subjective  Ethan Perez is a 50 y.o. male who presents for the following: Annual Exam (Mole check ). 6 months mole check hx of Melanoma on the right mid back paraspinal removed 04/07/2019  The patient presents for Total-Body Skin Exam (TBSE) for skin cancer screening and mole check.   The following portions of the chart were reviewed this encounter and updated as appropriate:   Tobacco  Allergies  Meds  Problems  Med Hx  Surg Hx  Fam Hx     Review of Systems:  No other skin or systemic complaints except as noted in HPI or Assessment and Plan.  Objective  Well appearing patient in no apparent distress; mood and affect are within normal limits.  A full examination was performed including scalp, head, eyes, ears, nose, lips, neck, chest, axillae, abdomen, back, buttocks, bilateral upper extremities, bilateral lower extremities, hands, feet, fingers, toes, fingernails, and toenails. All findings within normal limits unless otherwise noted below.  right xyphoid 1.0 cm Subcutaneous nodule.    Assessment & Plan  Epidermal inclusion cyst right xyphoid  Benign-appearing. Exam most consistent with an epidermal inclusion cyst. Discussed that a cyst is a benign growth that can grow over time and sometimes get irritated or inflamed. Recommend observation if it is not bothersome. Discussed option of surgical excision to remove it if it is growing, symptomatic, or other changes noted. Please call for new or changing lesions so they can be evaluated.  Recommend surgery removal   Skin cancer screening  Lentigines - Scattered tan macules - Due to sun exposure - Benign-appering, observe - Recommend daily broad spectrum sunscreen SPF 30+ to sun-exposed areas, reapply every 2 hours as needed. - Call for any changes  Seborrheic Keratoses - Stuck-on, waxy, tan-brown papules and/or plaques  - Benign-appearing - Discussed benign etiology and prognosis. - Observe - Call  for any changes  Melanocytic Nevi - Tan-brown and/or pink-flesh-colored symmetric macules and papules - Benign appearing on exam today - Observation - Call clinic for new or changing moles - Recommend daily use of broad spectrum spf 30+ sunscreen to sun-exposed areas.   Hemangiomas - Red papules - Discussed benign nature - Observe - Call for any changes  Actinic Damage - Chronic condition, secondary to cumulative UV/sun exposure - diffuse scaly erythematous macules with underlying dyspigmentation - Recommend daily broad spectrum sunscreen SPF 30+ to sun-exposed areas, reapply every 2 hours as needed.  - Staying in the shade or wearing long sleeves, sun glasses (UVA+UVB protection) and wide brim hats (4-inch brim around the entire circumference of the hat) are also recommended for sun protection.  - Call for new or changing lesions.  Skin cancer screening performed today.   Return in about 6 months (around 01/19/2022) for TBSE, hx of Melanoma, return for cyst sugery .  IMarye Round, CMA, am acting as scribe for Sarina Ser, MD .  Documentation: I have reviewed the above documentation for accuracy and completeness, and I agree with the above.  Sarina Ser, MD

## 2021-07-20 ENCOUNTER — Encounter: Payer: Self-pay | Admitting: Dermatology

## 2021-09-12 ENCOUNTER — Encounter: Payer: 59 | Admitting: Dermatology

## 2021-10-04 ENCOUNTER — Telehealth: Payer: Self-pay

## 2021-10-04 NOTE — Telephone Encounter (Signed)
Patient is scheduled for surgery with you next Tuesday. He recently was treated for bronchitis and pneumonia. He was on Prednisone but has finished that course and will finish is Doxycycline tomorrow. Patient wanted to make you aware and confirm it will still okay to come for cyst removal surgery next Tuesday.

## 2021-10-05 NOTE — Telephone Encounter (Signed)
Patient advised OK to come in for surgery.

## 2021-10-10 ENCOUNTER — Ambulatory Visit (INDEPENDENT_AMBULATORY_CARE_PROVIDER_SITE_OTHER): Payer: 59 | Admitting: Dermatology

## 2021-10-10 ENCOUNTER — Other Ambulatory Visit: Payer: Self-pay

## 2021-10-10 DIAGNOSIS — D492 Neoplasm of unspecified behavior of bone, soft tissue, and skin: Secondary | ICD-10-CM

## 2021-10-10 DIAGNOSIS — L720 Epidermal cyst: Secondary | ICD-10-CM

## 2021-10-10 MED ORDER — MUPIROCIN 2 % EX OINT
1.0000 | TOPICAL_OINTMENT | Freq: Every day | CUTANEOUS | 1 refills | Status: DC
Start: 2021-10-10 — End: 2021-12-06

## 2021-10-10 NOTE — Progress Notes (Signed)
   Follow-Up Visit   Subjective  Ethan Perez is a 50 y.o. male who presents for the following: cyst (R  xyphoid, pt presents for excision).  The following portions of the chart were reviewed this encounter and updated as appropriate:   Tobacco  Allergies  Meds  Problems  Med Hx  Surg Hx  Fam Hx     Review of Systems:  No other skin or systemic complaints except as noted in HPI or Assessment and Plan.  Objective  Well appearing patient in no apparent distress; mood and affect are within normal limits.  A focused examination was performed including chest. Relevant physical exam findings are noted in the Assessment and Plan.  R xyphoid Cystic pap 1.5cm   Assessment & Plan  Neoplasm of skin R xyphoid  Skin excision  Lesion length (cm):  1.5 Lesion width (cm):  1.5 Margin per side (cm):  0 Total excision diameter (cm):  1.5 Informed consent: discussed and consent obtained   Timeout: patient name, date of birth, surgical site, and procedure verified   Procedure prep:  Patient was prepped and draped in usual sterile fashion Prep type:  Isopropyl alcohol and povidone-iodine Anesthesia: the lesion was anesthetized in a standard fashion   Anesthetic:  1% lidocaine w/ epinephrine 1-100,000 buffered w/ 8.4% NaHCO3 (3cc lido w/ epi, 3cc bupivicaine, total 6cc) Instrument used: #15 blade   Hemostasis achieved with: pressure   Hemostasis achieved with comment:  Electrocautery Outcome: patient tolerated procedure well with no complications   Post-procedure details: sterile dressing applied and wound care instructions given   Dressing type: bandage, pressure dressing and bacitracin (Mupirocin)    Skin repair Complexity:  Complex Final length (cm):  2.7 Reason for type of repair: reduce tension to allow closure, reduce the risk of dehiscence, infection, and necrosis, reduce subcutaneous dead space and avoid a hematoma, allow closure of the large defect, preserve normal anatomy,  preserve normal anatomical and functional relationships and enhance both functionality and cosmetic results   Undermining: area extensively undermined   Undermining comment:  Undermining Defect 1.5cm Subcutaneous layers (deep stitches):  Suture size:  2-0 Suture type: Vicryl (polyglactin 910)   Subcutaneous suture technique: Inverted Dermal. Fine/surface layer approximation (top stitches):  Suture size:  3-0 Suture type: nylon   Stitches: horizontal mattress   Suture removal (days):  7 Hemostasis achieved with: pressure Outcome: patient tolerated procedure well with no complications   Post-procedure details: sterile dressing applied and wound care instructions given   Dressing type: bandage, pressure dressing and bacitracin (Mupirocin)    mupirocin ointment (BACTROBAN) 2 % Apply 1 application topically daily. Qd to excision site  Specimen 1 - Surgical pathology Differential Diagnosis: D48.5 Cyst vs other  Check Margins: No Cystic pap 1.5cm  Cyst vs other  Start Mupirocin oint qd to excision site  Return in about 1 week (around 10/17/2021) for suture removal.  I, [name], RMA, am acting as scribe for Sarina Ser, MD . Documentation: I have reviewed the above documentation for accuracy and completeness, and I agree with the above.  Sarina Ser, MD

## 2021-10-10 NOTE — Patient Instructions (Signed)
Wound Care Instructions  Cleanse wound gently with soap and water once a day then pat dry with clean gauze. Apply a thing coat of Petrolatum (petroleum jelly, "Vaseline") over the wound (unless you have an allergy to this). We recommend that you use a new, sterile tube of Vaseline. Do not pick or remove scabs. Do not remove the yellow or white "healing tissue" from the base of the wound.  Cover the wound with fresh, clean, nonstick gauze and secure with paper tape. You may use Band-Aids in place of gauze and tape if the would is small enough, but would recommend trimming much of the tape off as there is often too much. Sometimes Band-Aids can irritate the skin.  You should call the office for your biopsy report after 1 week if you have not already been contacted.  If you experience any problems, such as abnormal amounts of bleeding, swelling, significant bruising, significant pain, or evidence of infection, please call the office immediately.  FOR ADULT SURGERY PATIENTS: If you need something for pain relief you may take 1 extra strength Tylenol (acetaminophen) AND 2 Ibuprofen (200mg each) together every 4 hours as needed for pain. (do not take these if you are allergic to them or if you have a reason you should not take them.) Typically, you may only need pain medication for 1 to 3 days.   If you have any questions or concerns for your doctor, please call our main line at [phone] and press option 4 to reach your doctor's medical assistant. If no one answers, please leave a voicemail as directed and we will return your call as soon as possible. Messages left after 4 pm will be answered the following business day.   You may also send us a message via MyChart. We typically respond to MyChart messages within 1-2 business days.  For prescription refills, please ask your pharmacy to contact our office. Our fax number is [phone].  If you have an urgent issue when the clinic is closed that  cannot wait until the next business day, you can page your doctor at the number below.    Please note that while we do our best to be available for urgent issues outside of office hours, we are not available 24/7.   If you have an urgent issue and are unable to reach us, you may choose to seek medical care at your doctor's office, retail clinic, urgent care center, or emergency room.  If you have a medical emergency, please immediately call 911 or go to the emergency department.  Pager Numbers  - Dr. Kowalski: [phone]  - Dr. Moye: [phone]  - Dr. Stewart: [phone]  In the event of inclement weather, please call our main line at [phone] for an update on the status of any delays or closures.  Dermatology Medication Tips: Please keep the boxes that topical medications come in in order to help keep track of the instructions about where and how to use these. Pharmacies typically print the medication instructions only on the boxes and not directly on the medication tubes.   If your medication is too expensive, please contact our office at [phone] option 4 or send us a message through MyChart.   We are unable to tell what your co-pay for medications will be in advance as this is different depending on your insurance coverage. However, we may be able to find a substitute medication at lower cost or fill out paperwork to get insurance to cover a needed   medication.   If a prior authorization is required to get your medication covered by your insurance company, please allow us 1-2 business days to complete this process.  Drug prices often vary depending on where the prescription is filled and some pharmacies may offer cheaper prices.  The website www.goodrx.com contains coupons for medications through different pharmacies. The prices here do not account for what the cost may be with help from insurance (it may be cheaper with your insurance), but the website can give you the  price if you did not use any insurance.  - You can print the associated coupon and take it with your prescription to the pharmacy.  - You may also stop by our office during regular business hours and pick up a GoodRx coupon card.  - If you need your prescription sent electronically to a different pharmacy, notify our office through [facility] MyChart or by phone at [phone] option 4.   

## 2021-10-11 ENCOUNTER — Telehealth: Payer: Self-pay

## 2021-10-11 ENCOUNTER — Encounter: Payer: Self-pay | Admitting: Dermatology

## 2021-10-11 NOTE — Telephone Encounter (Signed)
Pt doing fine after yesterdays surgery./sh 

## 2021-10-17 ENCOUNTER — Other Ambulatory Visit: Payer: Self-pay

## 2021-10-17 ENCOUNTER — Ambulatory Visit (INDEPENDENT_AMBULATORY_CARE_PROVIDER_SITE_OTHER): Payer: 59 | Admitting: Dermatology

## 2021-10-17 ENCOUNTER — Ambulatory Visit: Payer: 59 | Admitting: Dermatology

## 2021-10-17 DIAGNOSIS — L720 Epidermal cyst: Secondary | ICD-10-CM

## 2021-10-17 NOTE — Patient Instructions (Signed)

## 2021-10-17 NOTE — Progress Notes (Signed)
   Follow-Up Visit   Subjective  Ethan Perez is a 50 y.o. male who presents for the following: post op/suture removal (Pathology proven benign cyst - R xyphoid, patient here today for suture removal).  The following portions of the chart were reviewed this encounter and updated as appropriate:   Tobacco  Allergies  Meds  Problems  Med Hx  Surg Hx  Fam Hx     Review of Systems:  No other skin or systemic complaints except as noted in HPI or Assessment and Plan.  Objective  Well appearing patient in no apparent distress; mood and affect are within normal limits.  A focused examination was performed including the trunk. Relevant physical exam findings are noted in the Assessment and Plan.  R xyphoid Healing excision site.   Assessment & Plan  Epidermal inclusion cyst R xyphoid  Encounter for Removal of Sutures - Incision site at the R xyphoid is clean, dry and intact - Wound cleansed, sutures removed, wound cleansed and steri strips applied.  - Discussed pathology results showing a benign cyst.  - Patient advised to keep steri-strips dry until they fall off. - Scars remodel for a full year. - Once steri-strips fall off, patient can apply over-the-counter silicone scar cream each night to help with scar remodeling if desired. - Patient advised to call with any concerns or if they notice any new or changing lesions.  Return for appointment as scheduled.  Luther Redo, CMA, am acting as scribe for Sarina Ser, MD . Documentation: I have reviewed the above documentation for accuracy and completeness, and I agree with the above.  Sarina Ser, MD

## 2021-10-18 ENCOUNTER — Encounter: Payer: Self-pay | Admitting: Dermatology

## 2021-11-21 ENCOUNTER — Ambulatory Visit: Payer: 59 | Admitting: Dermatology

## 2021-11-29 ENCOUNTER — Ambulatory Visit: Payer: 59 | Admitting: Internal Medicine

## 2021-12-06 ENCOUNTER — Encounter: Payer: Self-pay | Admitting: Internal Medicine

## 2021-12-06 ENCOUNTER — Other Ambulatory Visit: Payer: Self-pay

## 2021-12-06 ENCOUNTER — Ambulatory Visit (INDEPENDENT_AMBULATORY_CARE_PROVIDER_SITE_OTHER): Payer: 59 | Admitting: Internal Medicine

## 2021-12-06 VITALS — BP 135/87 | HR 73 | Temp 98.7°F | Ht 75.98 in | Wt 286.0 lb

## 2021-12-06 DIAGNOSIS — N401 Enlarged prostate with lower urinary tract symptoms: Secondary | ICD-10-CM | POA: Diagnosis not present

## 2021-12-06 DIAGNOSIS — Z23 Encounter for immunization: Secondary | ICD-10-CM

## 2021-12-06 DIAGNOSIS — E119 Type 2 diabetes mellitus without complications: Secondary | ICD-10-CM | POA: Insufficient documentation

## 2021-12-06 DIAGNOSIS — T148XXA Other injury of unspecified body region, initial encounter: Secondary | ICD-10-CM | POA: Insufficient documentation

## 2021-12-06 DIAGNOSIS — N3943 Post-void dribbling: Secondary | ICD-10-CM

## 2021-12-06 DIAGNOSIS — Z1159 Encounter for screening for other viral diseases: Secondary | ICD-10-CM | POA: Insufficient documentation

## 2021-12-06 MED ORDER — TAMSULOSIN HCL 0.4 MG PO CAPS
0.4000 mg | ORAL_CAPSULE | Freq: Every day | ORAL | 3 refills | Status: DC
Start: 2021-12-06 — End: 2022-05-03

## 2021-12-06 NOTE — Progress Notes (Signed)
BP 135/87    Pulse 73    Temp 98.7 F (37.1 C) (Oral)    Ht 6' 3.98" (1.93 m)    Wt 286 lb (129.7 kg)    SpO2 95%    BMI 34.83 kg/m    Subjective:    Patient ID: Ethan Perez, male    DOB: 07/31/1971, 50 y.o.   MRN: [id number]  Chief Complaint  Patient presents with   New Patient (Initial Visit)   Diabetes   Urinary Urgency    Has gotten worse in the last 6 months.    HPI: Ethan Perez is a 50 y.o. male  Pt is here to establish care was @ [facility] . Had bronchitis x over 2 months now was given steroids @ [facility] last course was 2 weeks.   Diabetes He presents for his initial (hasnt been checking it at home) diabetic visit. He has type 2 diabetes mellitus. Pertinent negatives for hypoglycemia include no confusion, dizziness, headaches, speech difficulty or tremors. Pertinent negatives for diabetes include no chest pain, no fatigue, no polydipsia, no polyphagia, no polyuria and no weakness.  Urinary Frequency  This is a chronic (urgency x 5 yrs) problem. The current episode started more than 1 year ago. Associated symptoms include frequency. Pertinent negatives include no chills, flank pain, hematuria, nausea, urgency or vomiting.   Chief Complaint  Patient presents with   New Patient (Initial Visit)   Diabetes   Urinary Urgency    Has gotten worse in the last 6 months.    Relevant past medical, surgical, family and social history reviewed and updated as indicated. Interim medical history since our last visit reviewed. Allergies and medications reviewed and updated.  Review of Systems  Constitutional:  Negative for activity change, appetite change, chills, fatigue and fever.  HENT:  Negative for congestion, ear discharge, ear pain and facial swelling.   Eyes:  Negative for pain, discharge and itching.  Respiratory:  Negative for cough, chest tightness, shortness of breath and wheezing.   Cardiovascular:  Negative for chest pain, palpitations and leg swelling.   Gastrointestinal:  Negative for abdominal distention, abdominal pain, blood in stool, constipation, diarrhea, nausea and vomiting.  Endocrine: Negative for cold intolerance, heat intolerance, polydipsia, polyphagia and polyuria.  Genitourinary:  Positive for frequency. Negative for difficulty urinating, dysuria, flank pain, hematuria and urgency.  Musculoskeletal:  Negative for arthralgias, gait problem, joint swelling and myalgias.  Skin:  Negative for color change, rash and wound.  Neurological:  Negative for dizziness, tremors, speech difficulty, weakness, light-headedness, numbness and headaches.  Hematological:  Does not bruise/bleed easily.  Psychiatric/Behavioral:  Negative for agitation, confusion, decreased concentration, sleep disturbance and suicidal ideas.    Per HPI unless specifically indicated above     Objective:    BP 135/87    Pulse 73    Temp 98.7 F (37.1 C) (Oral)    Ht 6' 3.98" (1.93 m)    Wt 286 lb (129.7 kg)    SpO2 95%    BMI 34.83 kg/m   Wt Readings from Last 3 Encounters:  12/06/21 286 lb (129.7 kg)  08/23/20 (!) 304 lb (137.9 kg)  04/05/20 (!) 312 lb (141.5 kg)    Physical Exam Vitals and nursing note reviewed.  Constitutional:      General: He is not in acute distress.    Appearance: Normal appearance. He is not ill-appearing or diaphoretic.  HENT:     Head: Normocephalic and atraumatic.     Right  Ear: Tympanic membrane and external ear normal. There is no impacted cerumen.     Left Ear: External ear normal.     Nose: No congestion or rhinorrhea.     Mouth/Throat:     Pharynx: No oropharyngeal exudate or posterior oropharyngeal erythema.  Eyes:     Conjunctiva/sclera: Conjunctivae normal.     Pupils: Pupils are equal, round, and reactive to light.  Cardiovascular:     Rate and Rhythm: Normal rate and regular rhythm.     Heart sounds: No murmur heard.   No friction rub. No gallop.  Pulmonary:     Effort: No respiratory distress.     Breath  sounds: No stridor. No wheezing or rhonchi.  Chest:     Chest wall: No tenderness.  Abdominal:     General: Abdomen is flat. Bowel sounds are normal.     Palpations: Abdomen is soft. There is no mass.     Tenderness: There is no abdominal tenderness.  Musculoskeletal:        General: Signs of injury present. No swelling, tenderness or deformity.     Cervical back: Normal range of motion and neck supple. No rigidity or tenderness.     Left lower leg: No edema.     Comments: Left foot wound    Skin:    General: Skin is warm and dry.  Neurological:     Mental Status: He is alert.     Cranial Nerves: No cranial nerve deficit.   Results for orders placed or performed in visit on 11/10/20  Aerobic culture  Result Value Ref Range   Aerobic Bacterial Culture Final report    Organism ID, Bacteria Comment         Current Outpatient Medications:    albuterol (PROVENTIL HFA;VENTOLIN HFA) 108 (90 Base) MCG/ACT inhaler, Inhale 1-2 puffs into the lungs every 6 (six) hours as needed for wheezing or shortness of breath., Disp: 1 Inhaler, Rfl: 0   aspirin-acetaminophen-caffeine (EXCEDRIN MIGRAINE) 250-250-65 MG tablet, Take 2 tablets by mouth every 6 (six) hours as needed for headache., Disp: , Rfl:    doxycycline (VIBRAMYCIN) 50 MG capsule, TAKE 2 CAPSULES( 100 MG TOTAL) BY MOUTH DAILY WITH FOOD AND DRINK, Disp: 60 capsule, Rfl: 2   glucose blood (ONETOUCH ULTRA) test strip, Use to check blood sugar twice a day., Disp: 100 each, Rfl: 1   tamsulosin (FLOMAX) 0.4 MG CAPS capsule, Take 2 capsules (0.8 mg total) by mouth daily., Disp: 60 capsule, Rfl: 3   ketoconazole (NIZORAL) 2 % shampoo, Apply 1 application topically 3 (three) times a week. Wash scalp 3 times weekly, let sit 5 minutes and rinse out (Patient not taking: Reported on 12/06/2021), Disp: 120 mL, Rfl: 4   metFORMIN (GLUCOPHAGE-XR) 500 MG 24 hr tablet, Take 2 tablets (1,000 mg total) by mouth 2 (two) times daily. (Patient not  taking: Reported on 12/06/2021), Disp: 360 tablet, Rfl: 1   mupirocin ointment (BACTROBAN) 2 %, Apply 1 application topically daily. Qd to excision site (Patient not taking: Reported on 12/06/2021), Disp: 22 g, Rfl: 1    Assessment & Plan:  Dm : Not been on metformin  Last a1c @ dec 2021 - 6.4  [facility] was getting [facility]. Took metformin for a while, stopped taking this  Started doing intermittent fasting.    2. Migraines:  Has had this occasionally , is on excedrin for such  Had last migraine attack x earlier in the year.    3. TDAP today  sec to pts wound - stepped on a nail    Problem List Items Addressed This Visit   None Visit Diagnoses     Open wound    -  Primary   Need for hepatitis C screening test            Orders Placed This Encounter  Procedures   Tdap vaccine greater than or equal to 7yo IM   CBC with Differential/Platelet   Comprehensive metabolic panel   TSH   Lipid panel   HgB A1c   Urinalysis, Routine w reflex microscopic   Urine Microalbumin w/creat. ratio   Ambulatory referral to Urology     No orders of the defined types were placed in this encounter.    Follow up plan: No follow-ups on file.  A1c this mornign was 7.7 start pt on metformin and jardiance for such

## 2021-12-07 ENCOUNTER — Telehealth: Payer: Self-pay

## 2021-12-07 ENCOUNTER — Encounter: Payer: Self-pay | Admitting: Internal Medicine

## 2021-12-07 LAB — HEMOGLOBIN A1C
Est. average glucose Bld gHb Est-mCnc: 174 mg/dL
Hgb A1c MFr Bld: 7.7 % — ABNORMAL HIGH (ref 4.8–5.6)

## 2021-12-07 LAB — MICROALBUMIN / CREATININE URINE RATIO
Creatinine, Urine: 124.5 mg/dL
Microalb/Creat Ratio: 12 mg/g creat (ref 0–29)
Microalbumin, Urine: 14.8 ug/mL

## 2021-12-07 LAB — URINALYSIS, ROUTINE W REFLEX MICROSCOPIC
Bilirubin, UA: NEGATIVE
Glucose, UA: NEGATIVE
Ketones, UA: NEGATIVE
Leukocytes,UA: NEGATIVE
Nitrite, UA: NEGATIVE
Protein,UA: NEGATIVE
RBC, UA: NEGATIVE
Specific Gravity, UA: 1.021 (ref 1.005–1.030)
Urobilinogen, Ur: 1 mg/dL (ref 0.2–1.0)
pH, UA: 6.5 (ref 5.0–7.5)

## 2021-12-07 MED ORDER — EMPAGLIFLOZIN 10 MG PO TABS: 10.0000 mg | ORAL_TABLET | Freq: Every day | ORAL | 2 refills | Status: DC

## 2021-12-07 MED ORDER — METFORMIN HCL 500 MG PO TABS: 500.0000 mg | ORAL_TABLET | Freq: Two times a day (BID) | ORAL | 3 refills | Status: DC

## 2021-12-07 NOTE — Telephone Encounter (Signed)
°  Spoke with patient and relayed message from Dr. Neomia Dear that she has sent in to his pharmacy Jardiance and Metformin. Patient states that he will pick up tomorrow but will start his metformin tonight since he has some at home.   [11:41 AM] Vigg, [name] No he would like a call back [phone] Ethan Perez  [11:41 AM] Vigg, [name] ive sent him meds for his sugars his a1c was high pl let him know   [11:42 AM] Vigg, [name] i think it was jardiance and metformin since his a1c was 7.7 ie > 7.5 he needs these agents which would be protective to his heart and kidneys

## 2021-12-13 ENCOUNTER — Other Ambulatory Visit: Payer: Self-pay

## 2021-12-13 ENCOUNTER — Other Ambulatory Visit: Payer: 59

## 2021-12-13 DIAGNOSIS — E119 Type 2 diabetes mellitus without complications: Secondary | ICD-10-CM

## 2021-12-14 LAB — LIPID PANEL
Chol/HDL Ratio: 5.8 ratio — ABNORMAL HIGH (ref 0.0–5.0)
Cholesterol, Total: 215 mg/dL — ABNORMAL HIGH (ref 100–199)
HDL: 37 mg/dL — ABNORMAL LOW (ref >39)
LDL Chol Calc (NIH): 147 mg/dL — ABNORMAL HIGH (ref 0–99)
Triglycerides: 169 mg/dL — ABNORMAL HIGH (ref 0–149)
VLDL Cholesterol Cal: 31 mg/dL (ref 5–40)

## 2021-12-29 ENCOUNTER — Ambulatory Visit (INDEPENDENT_AMBULATORY_CARE_PROVIDER_SITE_OTHER): Payer: 59 | Admitting: Internal Medicine

## 2021-12-29 ENCOUNTER — Other Ambulatory Visit: Payer: Self-pay

## 2021-12-29 ENCOUNTER — Encounter: Payer: Self-pay | Admitting: Internal Medicine

## 2021-12-29 VITALS — BP 114/82 | HR 89 | Temp 98.0°F | Ht 75.98 in | Wt 278.8 lb

## 2021-12-29 DIAGNOSIS — E7849 Other hyperlipidemia: Secondary | ICD-10-CM | POA: Diagnosis not present

## 2021-12-29 DIAGNOSIS — E119 Type 2 diabetes mellitus without complications: Secondary | ICD-10-CM | POA: Diagnosis not present

## 2021-12-29 NOTE — Progress Notes (Signed)
BP 114/82    Pulse 89    Temp 98 F (36.7 C) (Oral)    Ht 6' 3.98" (1.93 m)    Wt 278 lb 12.8 oz (126.5 kg)    SpO2 95%    BMI 33.95 kg/m    Subjective:    Patient ID: Ethan Perez, male    DOB: [birth year], 51 y.o.   MRN: [id number]  Chief Complaint  Patient presents with   Diabetes   Benign Prostatic Hypertrophy   Gastroesophageal Reflux    HPI: Ethan Perez is a 51 y.o. male    Patient presents with: Diabetes Benign Prostatic Hypertrophy Gastroesophageal Reflux  Lab Results      Component                Value               Date                      HGBA1C                   7.7 (H)             12/06/2021             Diabetes He presents for his follow-up (loosing weight 8 lbs in 3 weeks. intermittent fasting - doesnt eat after 8 is also on jardiace 10mg ) diabetic visit. He has type 2 diabetes mellitus. His disease course has been improving.  Hyperlipidemia This is a chronic problem. The problem is uncontrolled.   Chief Complaint  Patient presents with   Diabetes   Benign Prostatic Hypertrophy   Gastroesophageal Reflux    Relevant past medical, surgical, family and social history reviewed and updated as indicated. Interim medical history since our last visit reviewed. Allergies and medications reviewed and updated.  Review of Systems  Per HPI unless specifically indicated above     Objective:    BP 114/82    Pulse 89    Temp 98 F (36.7 C) (Oral)    Ht 6' 3.98" (1.93 m)    Wt 278 lb 12.8 oz (126.5 kg)    SpO2 95%    BMI 33.95 kg/m   Wt Readings from Last 3 Encounters:  12/29/21 278 lb 12.8 oz (126.5 kg)  12/06/21 286 lb (129.7 kg)  08/23/20 (!) 304 lb (137.9 kg)    Physical Exam  Results for orders placed or performed in visit on 12/13/21  Lipid panel  Result Value Ref Range   Cholesterol, Total 215 (H) 100 - 199 mg/dL   Triglycerides 169 (H) 0 - 149 mg/dL   HDL 37 (L) >39 mg/dL   VLDL Cholesterol Cal 31 5 - 40 mg/dL   LDL  Chol Calc (NIH) 147 (H) 0 - 99 mg/dL   Chol/HDL Ratio 5.8 (H) 0.0 - 5.0 ratio        Current Outpatient Medications:    metFORMIN (GLUCOPHAGE) 500 MG tablet, Take 1 tablet (500 mg total) by mouth in the morning and at bedtime. (Patient taking differently: Take 500 mg by mouth daily with breakfast.), Disp: 60 tablet, Rfl: 3   albuterol (PROVENTIL HFA;VENTOLIN HFA) 108 (90 Base) MCG/ACT inhaler, Inhale 1-2 puffs into the lungs every 6 (six) hours as needed for wheezing or shortness of breath., Disp: 1 Inhaler, Rfl: 0   aspirin-acetaminophen-caffeine (EXCEDRIN MIGRAINE) 250-250-65 MG tablet, Take 2 tablets by mouth every 6 (six) hours as needed for headache., Disp: ,  Rfl:    doxycycline (VIBRAMYCIN) 50 MG capsule, TAKE 2 CAPSULES( 100 MG TOTAL) BY MOUTH DAILY WITH FOOD AND DRINK, Disp: 60 capsule, Rfl: 2   empagliflozin (JARDIANCE) 10 MG TABS tablet, Take 1 tablet (10 mg total) by mouth daily before breakfast., Disp: 30 tablet, Rfl: 2   glucose blood (ONETOUCH ULTRA) test strip, Use to check blood sugar twice a day., Disp: 100 each, Rfl: 1   tamsulosin (FLOMAX) 0.4 MG CAPS capsule, Take 1 capsule (0.4 mg total) by mouth daily., Disp: 30 capsule, Rfl: 3    Assessment & Plan:  DM - is on jardiance 10 mg and metofromin change to q afternoons. check HbA1c,  urine  microalbumin  diabetic diet plan given to pt  adviced regarding hypoglycemia and instructions given to pt today on how to prevent and treat the same if it were to occur. pt acknowledges the plan and voices understanding of the same.  exercise plan given and encouraged.   advice diabetic yearly podiatry, ophthalmology , nutritionist , dental check q 6 months,  HLD : recheck FLP, check LFT's work on diet, SE of meds explained to pt. low fat and high fiber diet explained to pt.   Latest Reference Range & Units 12/13/21 08:48  Total CHOL/HDL Ratio 0.0 - 5.0 ratio 5.8 (H)  Cholesterol, Total 100 - 199 mg/dL 215 (H)  HDL Cholesterol >39  mg/dL 37 (L)  Triglycerides 0 - 149 mg/dL 169 (H)  VLDL Cholesterol Cal 5 - 40 mg/dL 31  LDL Chol Calc (NIH) 0 - 99 mg/dL 147 (H)  (H): Data is abnormally high (L): Data is abnormally low   Obesity: Lifestyle modifications advised to pt.   Portion control and avoiding high carb low fat diet advised.  Diet plan given to pt   exercise plan given and encouraged.  To increase exercise to 150 mins a week ie 21/2 hours a week. Pt verbalises understanding of the above.   Body mass index is 33.95 kg/m., Problem List Items Addressed This Visit   None    No orders of the defined types were placed in this encounter.    No orders of the defined types were placed in this encounter.    Follow up plan: No follow-ups on file.

## 2021-12-29 NOTE — Patient Instructions (Signed)

## 2022-01-02 NOTE — Progress Notes (Incomplete)
01/02/22 4:58 PM   Ethan Perez 05-Nov-1971 [id number]  Referring provider:  Charlynne Cousins, MD [address] [place],  [place] [address] No chief complaint on file.    HPI: Ethan Perez is a 51 y.o.male who presents today for further evaluation of BPH with post-void dribbling.   He was seen by he PCP, Dr. Neomia Dear on 12/06/2021 he was having urinary frequency at the time. Urinalysis was unremarkable. He was referred to urology for further evaluation.        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 Right mid back paraspinal, sup. medial. Excised 04/14/2019   Migraines    Numbness and tingling of left thumb    Obesity    Ruptured disc, cervical 2015    Surgical History: Past Surgical History:  Procedure Laterality Date   EYE SURGERY     KNEE SURGERY Left 2007   Arthoscopic   LAMINECTOMY  Feb 2016   MELANOMA EXCISION Right 04/14/2019   right mid back paraspinal sup med - [facility] [facility]   NASAL SEPTUM SURGERY  2001   sinus cancer removal  10/14/2018   TONSILLECTOMY  1976    Home Medications:  Allergies as of 01/05/2022       Reactions   Cat Hair Extract Shortness Of Breath   Rabbits, [place] pigs   Other Swelling   [place] nuts   Prochlorperazine    Tramadol Other (See Comments)   Sleep for 24 hr.   Compazine [prochlorperazine Edisylate] Other (See Comments)   Causes involuntary muscle movements.        Medication List        Accurate as of January 02, 2022  4:58 PM. If you have any questions, ask your nurse or  doctor.          albuterol 108 (90 Base) MCG/ACT inhaler Commonly known as: VENTOLIN HFA Inhale 1-2 puffs into the lungs every 6 (six) hours as needed for wheezing or shortness of breath.   aspirin-acetaminophen-caffeine 250-250-65 MG tablet Commonly known as: EXCEDRIN MIGRAINE Take 2 tablets by mouth every 6 (six) hours as needed for headache.   doxycycline 50 MG capsule Commonly known as: VIBRAMYCIN TAKE 2 CAPSULES( 100 MG TOTAL) BY MOUTH DAILY WITH FOOD AND DRINK   empagliflozin 10 MG Tabs tablet Commonly known as: Jardiance Take 1 tablet (10 mg total) by mouth daily before breakfast.   metFORMIN 500 MG tablet Commonly known as: GLUCOPHAGE Take 1 tablet (500 mg total) by mouth in the morning and at bedtime. What changed: when to take this   OneTouch Ultra test strip Generic drug: glucose blood Use to check blood sugar twice a day.   tamsulosin 0.4 MG Caps capsule Commonly known as: FLOMAX Take 1 capsule (0.4 mg total) by mouth daily.        Allergies:  Allergies  Allergen Reactions   Cat Hair Extract Shortness Of Breath    Rabbits, [place] pigs   Other Swelling    [place] nuts   Prochlorperazine  Tramadol Other (See Comments)    Sleep for 24 hr.   Compazine [Prochlorperazine Edisylate] Other (See Comments)    Causes involuntary muscle movements.    Family History: Family History  Problem Relation Age of Onset   Cirrhosis Mother    Seizures Mother        s/p hit her head   Diabetes Mother    Hypertension Mother    Heart disease Mother    Kidney failure Mother    Migraines Father        with aura   Heart disease Paternal Grandfather    Lung disease Paternal Grandfather        worked in a Loss adjuster, chartered house   Alcohol abuse Paternal Grandfather    Diabetes Brother    Migraines Brother        with aura   Diabetes Maternal Grandmother    Heart disease Maternal Grandmother    Diabetes Maternal Grandfather    Heart disease Maternal Grandfather     Stroke Maternal Grandfather    Heart disease Paternal Grandmother    Cancer Paternal Aunt     Social History:  reports that he has never smoked. He has never used smokeless tobacco. He reports that he does not drink alcohol and does not use drugs.   Physical Exam: There were no vitals taken for this visit.  Constitutional:  Alert and oriented, No acute distress. HEENT: [place] AT, moist mucus membranes.  Trachea midline, no masses. Cardiovascular: No clubbing, cyanosis, or edema. Respiratory: Normal respiratory effort, no increased work of breathing. Skin: No rashes, bruises or suspicious lesions. Neurologic: Grossly intact, no focal deficits, moving all 4 extremities. Psychiatric: Normal mood and affect.  Laboratory Data:  Lab Results  Component Value Date   CREATININE 0.83 04/05/2020   Lab Results  Component Value Date   PSA 0.4 05/06/2018   Lab Results  Component Value Date   HGBA1C 7.7 (H) 12/06/2021    Urinalysis   Pertinent Imaging:    Assessment & Plan:     No follow-ups on file.  I,Kailey Littlejohn,acting as a scribe for Hollice Espy, MD.,have documented all relevant documentation on the behalf of Hollice Espy, MD,as directed by  Hollice Espy, MD while in the presence of Hollice Espy, MD.  [facility] [address] [place], [place] [address] [phone]'

## 2022-01-05 ENCOUNTER — Ambulatory Visit: Payer: 59 | Admitting: Urology

## 2022-01-24 ENCOUNTER — Other Ambulatory Visit: Payer: Self-pay

## 2022-01-24 ENCOUNTER — Ambulatory Visit (INDEPENDENT_AMBULATORY_CARE_PROVIDER_SITE_OTHER): Payer: 59 | Admitting: Dermatology

## 2022-01-24 DIAGNOSIS — L821 Other seborrheic keratosis: Secondary | ICD-10-CM

## 2022-01-24 DIAGNOSIS — Z872 Personal history of diseases of the skin and subcutaneous tissue: Secondary | ICD-10-CM | POA: Diagnosis not present

## 2022-01-24 DIAGNOSIS — L814 Other melanin hyperpigmentation: Secondary | ICD-10-CM

## 2022-01-24 DIAGNOSIS — Z1283 Encounter for screening for malignant neoplasm of skin: Secondary | ICD-10-CM

## 2022-01-24 DIAGNOSIS — Z86006 Personal history of melanoma in-situ: Secondary | ICD-10-CM | POA: Diagnosis not present

## 2022-01-24 DIAGNOSIS — L905 Scar conditions and fibrosis of skin: Secondary | ICD-10-CM | POA: Diagnosis not present

## 2022-01-24 DIAGNOSIS — D229 Melanocytic nevi, unspecified: Secondary | ICD-10-CM

## 2022-01-24 DIAGNOSIS — L578 Other skin changes due to chronic exposure to nonionizing radiation: Secondary | ICD-10-CM

## 2022-01-24 DIAGNOSIS — D1800 Hemangioma unspecified site: Secondary | ICD-10-CM

## 2022-01-24 NOTE — Patient Instructions (Signed)

## 2022-01-24 NOTE — Progress Notes (Signed)
° °  Follow-Up Visit   Subjective  Ethan Perez is a 51 y.o. male who presents for the following: Annual Exam (The patient presents for Total-Body Skin Exam (TBSE) for skin cancer screening and mole check.  The patient has spots, moles and lesions to be evaluated, some may be new or changing and the patient has concerns that these could be cancer. ).  The following portions of the chart were reviewed this encounter and updated as appropriate:   Tobacco   Allergies   Meds   Problems   Med Hx   Surg Hx   Fam Hx      Review of Systems:  No other skin or systemic complaints except as noted in HPI or Assessment and Plan.  Objective  Well appearing patient in no apparent distress; mood and affect are within normal limits.  A full examination was performed including scalp, head, eyes, ears, nose, lips, neck, chest, axillae, abdomen, back, buttocks, bilateral upper extremities, bilateral lower extremities, hands, feet, fingers, toes, fingernails, and toenails. All findings within normal limits unless otherwise noted below.  right xyphoid Dyspigmented smooth macule or patch.    Assessment & Plan  Scar right xyphoid Hx of cyst  Clear. Observe for recurrence. Call clinic for new or changing lesions.  Recommend regular skin exams, daily broad-spectrum spf 30+ sunscreen use, and photoprotection.     Skin cancer screening  Lentigines - Scattered tan macules - Due to sun exposure - Benign-appearing, observe - Recommend daily broad spectrum sunscreen SPF 30+ to sun-exposed areas, reapply every 2 hours as needed. - Call for any changes  Seborrheic Keratoses - Stuck-on, waxy, tan-brown papules and/or plaques  - Benign-appearing - Discussed benign etiology and prognosis. - Observe - Call for any changes  Melanocytic Nevi - Tan-brown and/or pink-flesh-colored symmetric macules and papules - Benign appearing on exam today - Observation - Call clinic for new or changing moles - Recommend  daily use of broad spectrum spf 30+ sunscreen to sun-exposed areas.   Hemangiomas - Red papules - Discussed benign nature - Observe - Call for any changes  Actinic Damage - Chronic condition, secondary to cumulative UV/sun exposure - diffuse scaly erythematous macules with underlying dyspigmentation - Recommend daily broad spectrum sunscreen SPF 30+ to sun-exposed areas, reapply every 2 hours as needed.  - Staying in the shade or wearing long sleeves, sun glasses (UVA+UVB protection) and wide brim hats (4-inch brim around the entire circumference of the hat) are also recommended for sun protection.  - Call for new or changing lesions.  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  - Call if any new or changing lesions are noted between office visits   Skin cancer screening performed today.   Return in about 6 months (around 07/24/2022) for TBSE, hx of Melanoma in situ.  IMarye Round, CMA, am acting as scribe for Sarina Ser, MD .  Documentation: I have reviewed the above documentation for accuracy and completeness, and I agree with the above.  Sarina Ser, MD

## 2022-01-27 ENCOUNTER — Encounter: Payer: Self-pay | Admitting: Dermatology

## 2022-03-07 ENCOUNTER — Other Ambulatory Visit: Payer: Self-pay

## 2022-03-07 ENCOUNTER — Encounter: Payer: Self-pay | Admitting: Internal Medicine

## 2022-03-07 ENCOUNTER — Ambulatory Visit: Payer: 59 | Admitting: Internal Medicine

## 2022-03-07 VITALS — BP 130/88 | HR 77 | Temp 98.0°F | Ht 75.98 in | Wt 294.0 lb

## 2022-03-07 DIAGNOSIS — E119 Type 2 diabetes mellitus without complications: Secondary | ICD-10-CM | POA: Diagnosis not present

## 2022-03-07 DIAGNOSIS — J029 Acute pharyngitis, unspecified: Secondary | ICD-10-CM

## 2022-03-07 MED ORDER — FEXOFENADINE HCL 180 MG PO TABS: 180.0000 mg | ORAL_TABLET | Freq: Every day | ORAL | 1 refills | Status: DC
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
## 2022-03-08 ENCOUNTER — Other Ambulatory Visit: Payer: Self-pay | Admitting: Internal Medicine

## 2022-03-08 LAB — CBC WITH DIFFERENTIAL/PLATELET
Basophils Absolute: 0.1 10*3/uL (ref 0.0–0.2)
Basos: 1 %
EOS (ABSOLUTE): 0.3 10*3/uL (ref 0.0–0.4)
Eos: 4 %
Hematocrit: 48.8 % (ref 37.5–51.0)
Hemoglobin: 17 g/dL (ref 13.0–17.7)
Immature Grans (Abs): 0 10*3/uL (ref 0.0–0.1)
Immature Granulocytes: 0 %
Lymphocytes Absolute: 3.1 10*3/uL (ref 0.7–3.1)
Lymphs: 38 %
MCH: 29.7 pg (ref 26.6–33.0)
MCHC: 34.8 g/dL (ref 31.5–35.7)
MCV: 85 fL (ref 79–97)
Monocytes Absolute: 0.6 10*3/uL (ref 0.1–0.9)
Monocytes: 8 %
Neutrophils Absolute: 3.9 10*3/uL (ref 1.4–7.0)
Neutrophils: 49 %
Platelets: 220 10*3/uL (ref 150–450)
RBC: 5.72 x10E6/uL (ref 4.14–5.80)
RDW: 12.9 % (ref 11.6–15.4)
WBC: 8 10*3/uL (ref 3.4–10.8)

## 2022-03-08 LAB — COMPREHENSIVE METABOLIC PANEL
ALT: 34 IU/L (ref 0–44)
AST: 28 IU/L (ref 0–40)
Albumin/Globulin Ratio: 1.5 (ref 1.2–2.2)
Albumin: 4.4 g/dL (ref 4.0–5.0)
Alkaline Phosphatase: 85 IU/L (ref 44–121)
BUN/Creatinine Ratio: 11 (ref 9–20)
BUN: 11 mg/dL (ref 6–24)
Bilirubin Total: 0.8 mg/dL (ref 0.0–1.2)
CO2: 25 mmol/L (ref 20–29)
Calcium: 9.4 mg/dL (ref 8.7–10.2)
Chloride: 101 mmol/L (ref 96–106)
Creatinine, Ser: 1 mg/dL (ref 0.76–1.27)
Globulin, Total: 3 g/dL (ref 1.5–4.5)
Glucose: 100 mg/dL — ABNORMAL HIGH (ref 70–99)
Potassium: 3.9 mmol/L (ref 3.5–5.2)
Sodium: 138 mmol/L (ref 134–144)
Total Protein: 7.4 g/dL (ref 6.0–8.5)
eGFR: 92 mL/min/{1.73_m2} (ref >59)

## 2022-03-08 LAB — LIPID PANEL
Chol/HDL Ratio: 5.8 ratio — ABNORMAL HIGH (ref 0.0–5.0)
Cholesterol, Total: 225 mg/dL — ABNORMAL HIGH (ref 100–199)
HDL: 39 mg/dL — ABNORMAL LOW (ref >39)
LDL Chol Calc (NIH): 162 mg/dL — ABNORMAL HIGH (ref 0–99)
Triglycerides: 131 mg/dL (ref 0–149)
VLDL Cholesterol Cal: 24 mg/dL (ref 5–40)

## 2022-03-08 MED ORDER — AMOXICILLIN-POT CLAVULANATE 875-125 MG PO TABS: 1.0000 | ORAL_TABLET | Freq: Two times a day (BID) | ORAL | 0 refills | Status: AC

## 2022-03-08 NOTE — Telephone Encounter (Signed)
Copied from [place] [phone]. Topic: General - Other ?>> Mar 08, 2022 10:13 AM Leward Quan A wrote: ?Reason for CRM: Patient called in to inform Dr Neomia Dear that he need to know the result of his strep test from yesterday and that he is having more pain in his throat today than he had yesterday. Asking for a call back today please at Ph# [phone] ?

## 2022-03-08 NOTE — Telephone Encounter (Signed)
Patient made aware of results and verbalized understanding.  

## 2022-03-09 ENCOUNTER — Other Ambulatory Visit: Payer: Self-pay | Admitting: Internal Medicine

## 2022-03-09 DIAGNOSIS — N3943 Post-void dribbling: Secondary | ICD-10-CM

## 2022-03-09 NOTE — Telephone Encounter (Signed)
Rx 12/06/21 #30 3RF- request for future fill- too soon ?Requested Prescriptions  ?Pending Prescriptions Disp Refills  ?? tamsulosin (FLOMAX) 0.4 MG CAPS capsule [Pharmacy Med Name: TAMSULOSIN 0.'4MG'$  CAPSULES] 30 capsule 3  ?  Sig: TAKE 1 CAPSULE(0.4 MG) BY MOUTH DAILY  ?  ? Urology: Alpha-Adrenergic Blocker Failed - 03/09/2022 10:09 AM  ?  ?  Failed - PSA in normal range and within 360 days  ?  PSA  ?Date Value Ref Range Status  ?05/06/2018 0.4 < OR = 4.0 ng/mL Final  ?  Comment:  ?  The total PSA value from this assay system is  ?standardized against the WHO standard. The test  ?result will be approximately 20% lower when compared  ?to the equimolar-standardized total PSA (Beckman  ?Coulter). Comparison of serial PSA results should be  ?interpreted with this fact in mind. ?. ?This test was performed using the Siemens  ?chemiluminescent method. Values obtained from  ?different assay methods cannot be used ?interchangeably. PSA levels, regardless of ?value, should not be interpreted as absolute ?evidence of the presence or absence of disease. ?  ?   ?  ?  Passed - Last BP in normal range  ?  BP Readings from Last 1 Encounters:  ?03/07/22 130/88  ?   ?  ?  Passed - Valid encounter within last 12 months  ?  Recent Outpatient Visits   ?      ? 2 days ago Diabetes mellitus without complication ([place])  ? [facility] Vigg, [name], MD  ? 2 months ago Diabetes mellitus without complication ([place])  ? [facility] Vigg, [name], MD  ? 3 months ago Open wound  ? [facility] Vigg, [name], MD  ? 1 year ago Type 2 diabetes mellitus with other specified complication, without long-term current use of insulin ([place])  ? [facility] Perrin, Dionne Bucy, MD  ? 1 year ago Type 2 diabetes mellitus with other specified complication, without long-term current use of insulin ([place])  ? [facility] Bacigalupo, Dionne Bucy, MD  ?  ?  ?Future Appointments   ?        ? In 4 months  Ralene Bathe, MD [place]  ?  ? ?  ?  ?  ? ?

## 2022-03-10 LAB — URINALYSIS, ROUTINE W REFLEX MICROSCOPIC
Bilirubin, UA: NEGATIVE
Glucose, UA: NEGATIVE
Ketones, UA: NEGATIVE
Leukocytes,UA: NEGATIVE
Nitrite, UA: NEGATIVE
Protein,UA: NEGATIVE
RBC, UA: NEGATIVE
Specific Gravity, UA: >1.03 — ABNORMAL HIGH (ref 1.005–1.030)
Urobilinogen, Ur: 1 mg/dL (ref 0.2–1.0)
pH, UA: 6 (ref 5.0–7.5)

## 2022-03-10 LAB — RAPID STREP SCREEN (MED CTR MEBANE ONLY): Strep Gp A Ag, IA W/Reflex: NEGATIVE

## 2022-03-10 LAB — BAYER DCA HB A1C WAIVED: HB A1C (BAYER DCA - WAIVED): 7 % — ABNORMAL HIGH (ref 4.8–5.6)

## 2022-03-10 LAB — CULTURE, GROUP A STREP: Strep A Culture: NEGATIVE

## 2022-03-15 ENCOUNTER — Encounter: Payer: Self-pay | Admitting: Internal Medicine

## 2022-03-19 ENCOUNTER — Ambulatory Visit: Payer: 59 | Admitting: Internal Medicine

## 2022-03-26 ENCOUNTER — Encounter: Payer: Self-pay | Admitting: Family Medicine

## 2022-03-26 ENCOUNTER — Ambulatory Visit: Payer: 59 | Admitting: Family Medicine

## 2022-03-26 VITALS — BP 126/84 | HR 83 | Temp 98.1°F | Wt 287.0 lb

## 2022-03-26 DIAGNOSIS — S93699A Other sprain of unspecified foot, initial encounter: Secondary | ICD-10-CM

## 2022-03-26 DIAGNOSIS — G4486 Cervicogenic headache: Secondary | ICD-10-CM

## 2022-03-26 MED ORDER — KETOROLAC TROMETHAMINE 60 MG/2ML IM SOLN
60.0000 mg | Freq: Once | INTRAMUSCULAR | Status: AC
  Administered 2022-03-26: 30 mg via INTRAMUSCULAR

## 2022-03-26 MED ORDER — KETOROLAC TROMETHAMINE 60 MG/2ML IM SOLN: 30.0000 mg | Freq: Once | INTRAMUSCULAR | Status: DC

## 2022-03-26 MED ORDER — CYCLOBENZAPRINE HCL 10 MG PO TABS: 10.0000 mg | ORAL_TABLET | Freq: Every day | ORAL | 3 refills | Status: DC

## 2022-03-26 MED ORDER — KETOROLAC TROMETHAMINE 30 MG/ML IJ SOLN: 30.0000 mg | Freq: Once | INTRAMUSCULAR | Status: DC

## 2022-03-26 NOTE — Progress Notes (Signed)
? ?BP 126/84   Pulse 83   Temp 98.1 ?F (36.7 ?C)   Wt 287 lb (130.2 kg)   SpO2 97%   BMI 34.95 kg/m?   ? ?Subjective:  ? ? Patient ID: Ethan Perez, male    DOB: 23-Aug-1971, 51 y.o.   MRN: [id number] ? ?HPI: ?Ethan Perez is a 51 y.o. male ? ?Chief Complaint  ?Patient presents with  ? Headache  ?  Patient states he has been having daily headaches for about 3 weeks, does take excedrin otc with some relief some days  ? Foot Pain  ?  Patient states he has been having right foot pain, hurts worse when he applies pressure to it   ? ?Ethan Perez presents today for evaluation of headaches. He notes that he has been having issues with his head hurting for about 3 weeks. He notes that he has a history of migraine headaches when he was in high school and a history of sinus cancer about 3 years ago. His sinus cancer was entirely removed. He has continued to follow with ENT without any issues. He last had a CT about a year ago, but has has direct visualization since then. He notes that he has also had issues with his neck s/p laminectomy and has had issues with neck pain causing headaches. He notes that his pain today is anterior face pain over the right eye. He notes it's worse when his neck is tight and bothering him. His pain is aching and sore. Better with stretching and worse with tight neck. No changes in his vision, no dizziness, no nosebleeds. No fevers, no chills, he is otherwise feeling well. ? ?FOOT PAIN ?Duration: couple of days ?Involved foot: right ?Mechanism of injury: unknown ?Location: over 5th metatarsal on the plantar side ?Onset: sudden  ?Severity: moderate  ?Quality:  aching and sore ?Frequency: constant ?Radiation: no ?Aggravating factors: walking  ?Alleviating factors: rest  ?Status: worse ?Treatments attempted: rest and ice  ?Relief with NSAIDs?:  No NSAIDs Taken ?Weakness with weight bearing or walking: no ?Morning stiffness: no ?Swelling: no ?Redness: no ?Bruising: no ?Paresthesias /  decreased sensation: no  ?Fevers:no ? ?Relevant past medical, surgical, family and social history reviewed and updated as indicated. Interim medical history since our last visit reviewed. ?Allergies and medications reviewed and updated. ? ?Review of Systems  ?Constitutional: Negative.   ?HENT: Negative.    ?Respiratory: Negative.    ?Cardiovascular: Negative.   ?Neurological:  Positive for headaches. Negative for dizziness, tremors, seizures, syncope, facial asymmetry, speech difficulty, weakness, light-headedness and numbness.  ?Psychiatric/Behavioral: Negative.    ? ?Per HPI unless specifically indicated above ? ?   ?Objective:  ?  ?BP 126/84   Pulse 83   Temp 98.1 ?F (36.7 ?C)   Wt 287 lb (130.2 kg)   SpO2 97%   BMI 34.95 kg/m?   ?Wt Readings from Last 3 Encounters:  ?03/26/22 287 lb (130.2 kg)  ?03/07/22 294 lb (133.4 kg)  ?12/29/21 278 lb 12.8 oz (126.5 kg)  ?  ?Physical Exam ?Vitals and nursing note reviewed.  ?Constitutional:   ?   General: He is not in acute distress. ?   Appearance: Normal appearance. He is not ill-appearing, toxic-appearing or diaphoretic.  ?HENT:  ?   Head: Normocephalic and atraumatic.  ?   Right Ear: External ear normal.  ?   Left Ear: External ear normal.  ?   Nose: Nose normal.  ?   Mouth/Throat:  ?   Mouth:  Mucous membranes are moist.  ?   Pharynx: Oropharynx is clear.  ?Eyes:  ?   General: No scleral icterus.    ?   Right eye: No discharge.     ?   Left eye: No discharge.  ?   Extraocular Movements: Extraocular movements intact.  ?   Conjunctiva/sclera: Conjunctivae normal.  ?   Pupils: Pupils are equal, round, and reactive to light.  ?Cardiovascular:  ?   Rate and Rhythm: Normal rate and regular rhythm.  ?   Pulses: Normal pulses.  ?   Heart sounds: Normal heart sounds. No murmur heard. ?  No friction rub. No gallop.  ?Pulmonary:  ?   Effort: Pulmonary effort is normal. No respiratory distress.  ?   Breath sounds: Normal breath sounds. No stridor. No wheezing, rhonchi or  rales.  ?Chest:  ?   Chest wall: No tenderness.  ?Musculoskeletal:     ?   General: Normal range of motion.  ?   Cervical back: Normal range of motion and neck supple.  ?Skin: ?   General: Skin is warm and dry.  ?   Capillary Refill: Capillary refill takes less than 2 seconds.  ?   Coloration: Skin is not jaundiced or pale.  ?   Findings: No bruising, erythema, lesion or rash.  ?Neurological:  ?   General: No focal deficit present.  ?   Mental Status: He is alert and oriented to person, place, and time. Mental status is at baseline.  ?Psychiatric:     ?   Mood and Affect: Mood normal.     ?   Behavior: Behavior normal.     ?   Thought Content: Thought content normal.     ?   Judgment: Judgment normal.  ? ? ?Results for orders placed or performed in visit on 03/07/22  ?Rapid Strep screen([facility])  ? Specimen: Other  ? Other  ?Result Value Ref Range  ? Strep Gp A Ag, IA W/Reflex Negative Negative  ?Culture, Group A Strep  ? Other  ?Result Value Ref Range  ? Strep A Culture Negative   ?Lipid panel  ?Result Value Ref Range  ? Cholesterol, Total 225 (H) 100 - 199 mg/dL  ? Triglycerides 131 0 - 149 mg/dL  ? HDL 39 (L) >39 mg/dL  ? VLDL Cholesterol Cal 24 5 - 40 mg/dL  ? LDL Chol Calc (NIH) 162 (H) 0 - 99 mg/dL  ? Chol/HDL Ratio 5.8 (H) 0.0 - 5.0 ratio  ?Comprehensive metabolic panel  ?Result Value Ref Range  ? Glucose 100 (H) 70 - 99 mg/dL  ? BUN 11 6 - 24 mg/dL  ? Creatinine, Ser 1.00 0.76 - 1.27 mg/dL  ? eGFR 92 >59 mL/min/1.73  ? BUN/Creatinine Ratio 11 9 - 20  ? Sodium 138 134 - 144 mmol/L  ? Potassium 3.9 3.5 - 5.2 mmol/L  ? Chloride 101 96 - 106 mmol/L  ? CO2 25 20 - 29 mmol/L  ? Calcium 9.4 8.7 - 10.2 mg/dL  ? Total Protein 7.4 6.0 - 8.5 g/dL  ? Albumin 4.4 4.0 - 5.0 g/dL  ? Globulin, Total 3.0 1.5 - 4.5 g/dL  ? Albumin/Globulin Ratio 1.5 1.2 - 2.2  ? Bilirubin Total 0.8 0.0 - 1.2 mg/dL  ? Alkaline Phosphatase 85 44 - 121 IU/L  ? AST 28 0 - 40 IU/L  ? ALT 34 0 - 44 IU/L  ?CBC with Differential/Platelet   ?Result Value Ref Range  ? WBC 8.0 3.4 -  10.8 x10E3/uL  ? RBC 5.72 4.14 - 5.80 x10E6/uL  ? Hemoglobin 17.0 13.0 - 17.7 g/dL  ? Hematocrit 48.8 37.5 - 51.0 %  ? MCV 85 79 - 97 fL  ? MCH 29.7 26.6 - 33.0 pg  ? MCHC 34.8 31.5 - 35.7 g/dL  ? RDW 12.9 11.6 - 15.4 %  ? Platelets 220 150 - 450 x10E3/uL  ? Neutrophils 49 Not Estab. %  ? Lymphs 38 Not Estab. %  ? Monocytes 8 Not Estab. %  ? Eos 4 Not Estab. %  ? Basos 1 Not Estab. %  ? Neutrophils Absolute 3.9 1.4 - 7.0 x10E3/uL  ? Lymphocytes Absolute 3.1 0.7 - 3.1 x10E3/uL  ? Monocytes Absolute 0.6 0.1 - 0.9 x10E3/uL  ? EOS (ABSOLUTE) 0.3 0.0 - 0.4 x10E3/uL  ? Basophils Absolute 0.1 0.0 - 0.2 x10E3/uL  ? Immature Granulocytes 0 Not Estab. %  ? Immature Grans (Abs) 0.0 0.0 - 0.1 x10E3/uL  ?Urinalysis, Routine w reflex microscopic  ?Result Value Ref Range  ? Specific Gravity, UA >1.030 (H) 1.005 - 1.030  ? pH, UA 6.0 5.0 - 7.5  ? Color, UA Yellow Yellow  ? Appearance Ur Clear Clear  ? Leukocytes,UA Negative Negative  ? Protein,UA Negative Negative/Trace  ? Glucose, UA Negative Negative  ? Ketones, UA Negative Negative  ? RBC, UA Negative Negative  ? Bilirubin, UA Negative Negative  ? Urobilinogen, Ur 1.0 0.2 - 1.0 mg/dL  ? Nitrite, UA Negative Negative  ?Bayer DCA Hb A1c Waived  ?Result Value Ref Range  ? HB A1C (BAYER DCA - WAIVED) 7.0 (H) 4.8 - 5.6 %  ? ?   ?Assessment & Plan:  ? ?Problem List Items Addressed This Visit   ?None ?Visit Diagnoses   ? ? Cervicogenic headache    -  Primary  ? Will start stretches and flexeril. Call with any concerns. If not improving in the next 2 weeks, will get him CT head given his history of sinus cancer.   ? Relevant Medications  ? cyclobenzaprine (FLEXERIL) 10 MG tablet  ? ketorolac (TORADOL) injection 60 mg (Completed)  ? Midtarsal joint sprain      ? x couple of days. Will treat with toradol today. RICE. If not getting better or getting worse will get x-ray.  ? ?  ?  ? ?Follow up plan: ?Return As scheduled with PCP. ? ? ? ? ? ?

## 2022-04-10 ENCOUNTER — Ambulatory Visit: Payer: Self-pay | Admitting: *Deleted

## 2022-04-10 NOTE — Telephone Encounter (Signed)
Reason for Disposition ? [1] Numbness in an arm or hand (i.e., loss of sensation) AND [2] upper back pain ? ?Answer Assessment - Initial Assessment Questions ?1. ONSET: "When did the pain begin?"  ?    Having upper back pain between shoulder blades ?Sunday night it started.  When woke up this morning left hand is "asleep" on and off. ?2. LOCATION: "Where does it hurt?" (upper, mid or lower back) ?    Left hand is asleep ?3. SEVERITY: "How bad is the pain?"  (e.g., Scale 1-10; mild, moderate, or severe) ?  - MILD (1-3): doesn't interfere with normal activities  ?  - MODERATE (4-7): interferes with normal activities or awakens from sleep  ?  - SEVERE (8-10): excruciating pain, unable to do any normal activities  ?    *No Answer* ?4. PATTERN: "Is the pain constant?" (e.g., yes, no; constant, intermittent)  ?    *No Answer* ?5. RADIATION: "Does the pain shoot into your legs or elsewhere?" ?    *No Answer* ?6. CAUSE:  "What do you think is causing the back pain?"  ?    *No Answer* ?7. BACK OVERUSE:  "Any recent lifting of heavy objects, strenuous work or exercise?" ?    *No Answer* ?8. MEDICATIONS: "What have you taken so far for the pain?" (e.g., nothing, acetaminophen, NSAIDS) ?    *No Answer* ?9. NEUROLOGIC SYMPTOMS: "Do you have any weakness, numbness, or problems with bowel/bladder control?" ?    *No Answer* ?10. OTHER SYMPTOMS: "Do you have any other symptoms?" (e.g., fever, abdominal pain, burning with urination, blood in urine) ?      *No Answer* ?11. PREGNANCY: "Is there any chance you are pregnant?" (e.g., yes, no; LMP) ?      *No Answer* ? ?Protocols used: Back Pain-A-AH ? ?

## 2022-04-10 NOTE — Telephone Encounter (Signed)
?  Chief Complaint: Upper back pain between shoulder blades and intermittent numbness/tingling of left hand ?Symptoms: above ?Frequency: Since Sunday night the back pain, Left hand asleep when he woke up this morning ?Pertinent Negatives: Patient denies problems walking ?Disposition: '[]'$ ED /'[]'$ Urgent Care (no appt availability in office) / '[x]'$ Appointment(In office/virtual)/ '[]'$  [facility]/ '[]'$ Home Care/ '[]'$ Refused Recommended Disposition /'[]'$ [facility]/ '[]'$  Follow-up with PCP ?Additional Notes: Appt made with Dr. Wynetta Emery at his request for next week on Mon.  ?

## 2022-04-16 ENCOUNTER — Encounter: Payer: Self-pay | Admitting: Family Medicine

## 2022-04-16 ENCOUNTER — Ambulatory Visit
Admission: RE | Admit: 2022-04-16 | Discharge: 2022-04-16 | Disposition: A | Discharge status: Home / Self Care | Payer: 59 | Attending: Family Medicine | Admitting: Family Medicine

## 2022-04-16 ENCOUNTER — Ambulatory Visit: Payer: 59 | Admitting: Family Medicine

## 2022-04-16 ENCOUNTER — Ambulatory Visit
Admission: RE | Admit: 2022-04-16 | Discharge: 2022-04-16 | Disposition: A | Discharge status: Home / Self Care | Payer: 59 | Source: Ambulatory Visit | Attending: Family Medicine | Admitting: Family Medicine

## 2022-04-16 VITALS — BP 131/83 | HR 84 | Temp 98.0°F | Wt 287.0 lb

## 2022-04-16 DIAGNOSIS — M4722 Other spondylosis with radiculopathy, cervical region: Secondary | ICD-10-CM | POA: Diagnosis not present

## 2022-04-16 DIAGNOSIS — M47812 Spondylosis without myelopathy or radiculopathy, cervical region: Secondary | ICD-10-CM | POA: Insufficient documentation

## 2022-04-16 DIAGNOSIS — M546 Pain in thoracic spine: Secondary | ICD-10-CM

## 2022-04-16 MED ORDER — TRIAMCINOLONE ACETONIDE 40 MG/ML IJ SUSP
40.0000 mg | Freq: Once | INTRAMUSCULAR | Status: AC
  Administered 2022-04-16: 40 mg via INTRAMUSCULAR

## 2022-04-16 MED ORDER — BACLOFEN 10 MG PO TABS: 10.0000 mg | ORAL_TABLET | Freq: Every evening | ORAL | 0 refills | Status: DC | PRN

## 2022-04-16 MED ORDER — PREDNISONE 10 MG PO TABS
ORAL_TABLET | ORAL | 0 refills | Status: DC
Start: 2022-04-16 — End: 2022-05-03

## 2022-04-16 NOTE — Progress Notes (Signed)
? ?BP 131/83   Pulse 84   Temp 98 ?F (36.7 ?C)   Wt 287 lb (130.2 kg)   SpO2 97%   BMI 34.95 kg/m?   ? ?Subjective:  ? ? Patient ID: Ethan Perez, male    DOB: 1971-10-21, 51 y.o.   MRN: [id number] ? ?HPI: ?Ethan Perez is a 51 y.o. male ? ?Chief Complaint  ?Patient presents with  ? Back Pain  ?  Patient states he is having upper back pain, between his shoulder blades. Patient states it has been hurting for about a week.   ? ?BACK PAIN ?Duration: about a week ?Mechanism of injury: unknown ?Location: midline and upper back ?Onset: sudden ?Severity: severe ?Quality: sharp ?Frequency: constant ?Radiation: L arm ?Aggravating factors: movement, touch ?Alleviating factors: nothing ?Status: worse ?Treatments attempted:  flexeril, rest, ice, heat, APAP, ibuprofen, and aleve  ?Relief with NSAIDs?: no ?Nighttime pain:  yes ?Paresthesias / decreased sensation:  no ?Bowel / bladder incontinence:  no ?Fevers:  no ?Dysuria / urinary frequency:  no ? ?Relevant past medical, surgical, family and social history reviewed and updated as indicated. Interim medical history since our last visit reviewed. ?Allergies and medications reviewed and updated. ? ?Review of Systems  ?Constitutional: Negative.   ?Respiratory: Negative.    ?Cardiovascular: Negative.   ?Gastrointestinal: Negative.   ?Musculoskeletal: Negative.   ?Psychiatric/Behavioral: Negative.    ? ?Per HPI unless specifically indicated above ? ?   ?Objective:  ?  ?BP 131/83   Pulse 84   Temp 98 ?F (36.7 ?C)   Wt 287 lb (130.2 kg)   SpO2 97%   BMI 34.95 kg/m?   ?Wt Readings from Last 3 Encounters:  ?04/16/22 287 lb (130.2 kg)  ?03/26/22 287 lb (130.2 kg)  ?03/07/22 294 lb (133.4 kg)  ?  ?Physical Exam ?Vitals and nursing note reviewed.  ?Constitutional:   ?   General: He is not in acute distress. ?   Appearance: Normal appearance. He is not ill-appearing, toxic-appearing or diaphoretic.  ?HENT:  ?   Head: Normocephalic and atraumatic.  ?   Right Ear:  External ear normal.  ?   Left Ear: External ear normal.  ?   Nose: Nose normal.  ?   Mouth/Throat:  ?   Mouth: Mucous membranes are moist.  ?   Pharynx: Oropharynx is clear.  ?Eyes:  ?   General: No scleral icterus.    ?   Right eye: No discharge.     ?   Left eye: No discharge.  ?   Extraocular Movements: Extraocular movements intact.  ?   Conjunctiva/sclera: Conjunctivae normal.  ?   Pupils: Pupils are equal, round, and reactive to light.  ?Cardiovascular:  ?   Rate and Rhythm: Normal rate and regular rhythm.  ?   Pulses: Normal pulses.  ?   Heart sounds: Normal heart sounds. No murmur heard. ?  No friction rub. No gallop.  ?Pulmonary:  ?   Effort: Pulmonary effort is normal. No respiratory distress.  ?   Breath sounds: Normal breath sounds. No stridor. No wheezing, rhonchi or rales.  ?Chest:  ?   Chest wall: No tenderness.  ?Musculoskeletal:     ?   General: Tenderness (midline tenderness about T10) present. Normal range of motion.  ?   Cervical back: Normal range of motion and neck supple.  ?Skin: ?   General: Skin is warm and dry.  ?   Capillary Refill: Capillary refill takes less than 2 seconds.  ?  Coloration: Skin is not jaundiced or pale.  ?   Findings: No bruising, erythema, lesion or rash.  ?Neurological:  ?   General: No focal deficit present.  ?   Mental Status: He is alert and oriented to person, place, and time. Mental status is at baseline.  ?Psychiatric:     ?   Mood and Affect: Mood normal.     ?   Behavior: Behavior normal.     ?   Thought Content: Thought content normal.     ?   Judgment: Judgment normal.  ? ? ?Results for orders placed or performed in visit on 03/07/22  ?Rapid Strep screen([facility])  ? Specimen: Other  ? Other  ?Result Value Ref Range  ? Strep Gp A Ag, IA W/Reflex Negative Negative  ?Culture, Group A Strep  ? Other  ?Result Value Ref Range  ? Strep A Culture Negative   ?Lipid panel  ?Result Value Ref Range  ? Cholesterol, Total 225 (H) 100 - 199 mg/dL  ? Triglycerides  131 0 - 149 mg/dL  ? HDL 39 (L) >39 mg/dL  ? VLDL Cholesterol Cal 24 5 - 40 mg/dL  ? LDL Chol Calc (NIH) 162 (H) 0 - 99 mg/dL  ? Chol/HDL Ratio 5.8 (H) 0.0 - 5.0 ratio  ?Comprehensive metabolic panel  ?Result Value Ref Range  ? Glucose 100 (H) 70 - 99 mg/dL  ? BUN 11 6 - 24 mg/dL  ? Creatinine, Ser 1.00 0.76 - 1.27 mg/dL  ? eGFR 92 >59 mL/min/1.73  ? BUN/Creatinine Ratio 11 9 - 20  ? Sodium 138 134 - 144 mmol/L  ? Potassium 3.9 3.5 - 5.2 mmol/L  ? Chloride 101 96 - 106 mmol/L  ? CO2 25 20 - 29 mmol/L  ? Calcium 9.4 8.7 - 10.2 mg/dL  ? Total Protein 7.4 6.0 - 8.5 g/dL  ? Albumin 4.4 4.0 - 5.0 g/dL  ? Globulin, Total 3.0 1.5 - 4.5 g/dL  ? Albumin/Globulin Ratio 1.5 1.2 - 2.2  ? Bilirubin Total 0.8 0.0 - 1.2 mg/dL  ? Alkaline Phosphatase 85 44 - 121 IU/L  ? AST 28 0 - 40 IU/L  ? ALT 34 0 - 44 IU/L  ?CBC with Differential/Platelet  ?Result Value Ref Range  ? WBC 8.0 3.4 - 10.8 x10E3/uL  ? RBC 5.72 4.14 - 5.80 x10E6/uL  ? Hemoglobin 17.0 13.0 - 17.7 g/dL  ? Hematocrit 48.8 37.5 - 51.0 %  ? MCV 85 79 - 97 fL  ? MCH 29.7 26.6 - 33.0 pg  ? MCHC 34.8 31.5 - 35.7 g/dL  ? RDW 12.9 11.6 - 15.4 %  ? Platelets 220 150 - 450 x10E3/uL  ? Neutrophils 49 Not Estab. %  ? Lymphs 38 Not Estab. %  ? Monocytes 8 Not Estab. %  ? Eos 4 Not Estab. %  ? Basos 1 Not Estab. %  ? Neutrophils Absolute 3.9 1.4 - 7.0 x10E3/uL  ? Lymphocytes Absolute 3.1 0.7 - 3.1 x10E3/uL  ? Monocytes Absolute 0.6 0.1 - 0.9 x10E3/uL  ? EOS (ABSOLUTE) 0.3 0.0 - 0.4 x10E3/uL  ? Basophils Absolute 0.1 0.0 - 0.2 x10E3/uL  ? Immature Granulocytes 0 Not Estab. %  ? Immature Grans (Abs) 0.0 0.0 - 0.1 x10E3/uL  ?Urinalysis, Routine w reflex microscopic  ?Result Value Ref Range  ? Specific Gravity, UA >1.030 (H) 1.005 - 1.030  ? pH, UA 6.0 5.0 - 7.5  ? Color, UA Yellow Yellow  ? Appearance Ur Clear Clear  ?  Leukocytes,UA Negative Negative  ? Protein,UA Negative Negative/Trace  ? Glucose, UA Negative Negative  ? Ketones, UA Negative Negative  ? RBC, UA Negative Negative  ?  Bilirubin, UA Negative Negative  ? Urobilinogen, Ur 1.0 0.2 - 1.0 mg/dL  ? Nitrite, UA Negative Negative  ?Bayer DCA Hb A1c Waived  ?Result Value Ref Range  ? HB A1C (BAYER DCA - WAIVED) 7.0 (H) 4.8 - 5.6 %  ? ?   ?Assessment & Plan:  ? ?Problem List Items Addressed This Visit   ? ?  ? Musculoskeletal and Integument  ? DJD (degenerative joint disease) of cervical spine  ?  Will treat with steroid taper and change to baclofen. Will check x-rays. Await results. Treat as needed.  ? ?  ?  ? Relevant Medications  ? baclofen (LIORESAL) 10 MG tablet  ? predniSONE (DELTASONE) 10 MG tablet  ? Other Relevant Orders  ? DG Thoracic Spine W/Swimmers  ? ?Other Visit Diagnoses   ? ? Acute midline thoracic back pain    -  Primary  ? Will treat with steroid taper and change to baclofen. Will check x-rays. Await results. Treat as needed.   ? Relevant Medications  ? baclofen (LIORESAL) 10 MG tablet  ? predniSONE (DELTASONE) 10 MG tablet  ? triamcinolone acetonide (KENALOG-40) injection 40 mg (Completed)  ? Other Relevant Orders  ? DG Cervical Spine Complete  ? ?  ?  ? ?Follow up plan: ?Return in about 2 weeks (around 04/30/2022). ? ? ? ? ? ?

## 2022-04-16 NOTE — Assessment & Plan Note (Signed)
Will treat with steroid taper and change to baclofen. Will check x-rays. Await results. Treat as needed.  ?

## 2022-05-03 ENCOUNTER — Encounter: Payer: Self-pay | Admitting: Family Medicine

## 2022-05-03 ENCOUNTER — Ambulatory Visit: Payer: 59 | Admitting: Family Medicine

## 2022-05-03 VITALS — BP 128/87 | HR 103 | Temp 98.2°F | Wt 284.0 lb

## 2022-05-03 DIAGNOSIS — G8929 Other chronic pain: Secondary | ICD-10-CM | POA: Diagnosis not present

## 2022-05-03 DIAGNOSIS — N3941 Urge incontinence: Secondary | ICD-10-CM

## 2022-05-03 DIAGNOSIS — N3943 Post-void dribbling: Secondary | ICD-10-CM

## 2022-05-03 DIAGNOSIS — M546 Pain in thoracic spine: Secondary | ICD-10-CM

## 2022-05-03 DIAGNOSIS — M436 Torticollis: Secondary | ICD-10-CM

## 2022-05-03 DIAGNOSIS — R3915 Urgency of urination: Secondary | ICD-10-CM

## 2022-05-03 DIAGNOSIS — N401 Enlarged prostate with lower urinary tract symptoms: Secondary | ICD-10-CM

## 2022-05-03 LAB — URINALYSIS, ROUTINE W REFLEX MICROSCOPIC
Bilirubin, UA: NEGATIVE
Glucose, UA: NEGATIVE
Ketones, UA: NEGATIVE
Leukocytes,UA: NEGATIVE
Nitrite, UA: NEGATIVE
RBC, UA: NEGATIVE
Specific Gravity, UA: >1.03 — ABNORMAL HIGH (ref 1.005–1.030)
Urobilinogen, Ur: 1 mg/dL (ref 0.2–1.0)
pH, UA: 6 (ref 5.0–7.5)

## 2022-05-03 LAB — MICROSCOPIC EXAMINATION
Epithelial Cells (non renal): NONE SEEN /hpf (ref 0–10)
RBC, Urine: NONE SEEN /hpf (ref 0–2)
WBC, UA: NONE SEEN /hpf (ref 0–5)

## 2022-05-03 MED ORDER — GABAPENTIN 100 MG PO CAPS: 100.0000 mg | ORAL_CAPSULE | Freq: Every day | ORAL | 3 refills | Status: DC

## 2022-05-03 MED ORDER — TAMSULOSIN HCL 0.4 MG PO CAPS: 0.8000 mg | ORAL_CAPSULE | Freq: Every day | ORAL | 1 refills | Status: DC

## 2022-05-03 MED ORDER — KETOROLAC TROMETHAMINE 60 MG/2ML IM SOLN
60.0000 mg | Freq: Once | INTRAMUSCULAR | Status: AC
  Administered 2022-05-03: 60 mg via INTRAMUSCULAR

## 2022-05-03 NOTE — Progress Notes (Signed)
? ?BP 128/87   Pulse (!) 103   Temp 98.2 ?F (36.8 ?C)   Wt 284 lb (128.8 kg)   SpO2 98%   BMI 34.58 kg/m?   ? ?Subjective:  ? ? Patient ID: Ethan Perez, male    DOB: 1971-05-15, 51 y.o.   MRN: [id number] ? ?HPI: ?Ethan Perez is a 51 y.o. male ? ?Chief Complaint  ?Patient presents with  ? Back Pain  ?  Patient here to follow up on back pain, states it is about the same   ? ?BACK/NECK PAIN ?Duration: couple of months- worsening ?Mechanism of injury: unknown ?Location: midline upper back and neck ?Onset: sudden ?Severity: severe ?Quality: sharp ?Frequency: constant ?Radiation: L arm and head ?Aggravating factors: movement and touch ?Alleviating factors: baclofen a little ?Status: worse ?Treatments attempted: steroids, baclofen, NSAIDs, Flexeril, Ice, heat, APAP, rest, stretches  ?Relief with NSAIDs?: no ?Nighttime pain:  yes ?Paresthesias / decreased sensation:  no ?Bowel / bladder incontinence:  no ?Fevers:  no ?Dysuria / urinary frequency:  no ? ?URINARY SYMPTOMS ?Duration: couple of weeks ?Dysuria: no ?Urinary frequency: yes ?Urgency: yes ?Small volume voids: no ?Symptom severity: moderate ?Urinary incontinence: no ?Foul odor: no ?Hematuria: no ?Abdominal pain: no ?Back pain: no ?Suprapubic pain/pressure: no ?Flank pain: no ?Fever:  no ?Vomiting: no ?Status: stable ?Previous urinary tract infection: no ?Recurrent urinary tract infection: no ?Penile discharge: no ?Treatments attempted: flomax ? ?Relevant past medical, surgical, family and social history reviewed and updated as indicated. Interim medical history since our last visit reviewed. ?Allergies and medications reviewed and updated. ? ?Review of Systems  ?Constitutional: Negative.   ?Respiratory: Negative.    ?Cardiovascular: Negative.   ?Gastrointestinal: Negative.   ?Musculoskeletal:  Positive for back pain, myalgias, neck pain and neck stiffness. Negative for arthralgias, gait problem and joint swelling.  ?Skin: Negative.    ?Neurological: Negative.   ?Psychiatric/Behavioral: Negative.    ? ?Per HPI unless specifically indicated above ? ?   ?Objective:  ?  ?BP 128/87   Pulse (!) 103   Temp 98.2 ?F (36.8 ?C)   Wt 284 lb (128.8 kg)   SpO2 98%   BMI 34.58 kg/m?   ?Wt Readings from Last 3 Encounters:  ?05/03/22 284 lb (128.8 kg)  ?04/16/22 287 lb (130.2 kg)  ?03/26/22 287 lb (130.2 kg)  ?  ?Physical Exam ?Vitals and nursing note reviewed.  ?Constitutional:   ?   General: He is not in acute distress. ?   Appearance: Normal appearance. He is not ill-appearing, toxic-appearing or diaphoretic.  ?HENT:  ?   Head: Normocephalic and atraumatic.  ?   Right Ear: External ear normal.  ?   Left Ear: External ear normal.  ?   Nose: Nose normal.  ?   Mouth/Throat:  ?   Mouth: Mucous membranes are moist.  ?   Pharynx: Oropharynx is clear.  ?Eyes:  ?   General: No scleral icterus.    ?   Right eye: No discharge.     ?   Left eye: No discharge.  ?   Extraocular Movements: Extraocular movements intact.  ?   Conjunctiva/sclera: Conjunctivae normal.  ?   Pupils: Pupils are equal, round, and reactive to light.  ?Cardiovascular:  ?   Rate and Rhythm: Normal rate and regular rhythm.  ?   Pulses: Normal pulses.  ?   Heart sounds: Normal heart sounds. No murmur heard. ?  No friction rub. No gallop.  ?Pulmonary:  ?   Effort:  Pulmonary effort is normal. No respiratory distress.  ?   Breath sounds: Normal breath sounds. No stridor. No wheezing, rhonchi or rales.  ?Chest:  ?   Chest wall: No tenderness.  ?Musculoskeletal:  ?   Cervical back: Normal range of motion and neck supple.  ?   Comments: Unable to turn his head to the L due to torticollis in the neck  ?Skin: ?   General: Skin is warm and dry.  ?   Capillary Refill: Capillary refill takes less than 2 seconds.  ?   Coloration: Skin is not jaundiced or pale.  ?   Findings: No bruising, erythema, lesion or rash.  ?Neurological:  ?   General: No focal deficit present.  ?   Mental Status: He is alert and  oriented to person, place, and time. Mental status is at baseline.  ?Psychiatric:     ?   Mood and Affect: Mood normal.     ?   Behavior: Behavior normal.     ?   Thought Content: Thought content normal.     ?   Judgment: Judgment normal.  ? ? ?Results for orders placed or performed in visit on 05/03/22  ?Microscopic Examination  ? Urine  ?Result Value Ref Range  ? WBC, UA None seen 0 - 5 /hpf  ? RBC None seen 0 - 2 /hpf  ? Epithelial Cells (non renal) None seen 0 - 10 /hpf  ? Mucus, UA Present (A) Not Estab.  ? Bacteria, UA Few (A) None seen/Few  ?Urinalysis, Routine w reflex microscopic  ?Result Value Ref Range  ? Specific Gravity, UA >1.030 (H) 1.005 - 1.030  ? pH, UA 6.0 5.0 - 7.5  ? Color, UA Amber (A) Yellow  ? Appearance Ur Clear Clear  ? Leukocytes,UA Negative Negative  ? Protein,UA 1+ (A) Negative/Trace  ? Glucose, UA Negative Negative  ? Ketones, UA Negative Negative  ? RBC, UA Negative Negative  ? Bilirubin, UA Negative Negative  ? Urobilinogen, Ur 1.0 0.2 - 1.0 mg/dL  ? Nitrite, UA Negative Negative  ? Microscopic Examination See below:   ? ?   ?Assessment & Plan:  ? ?Problem List Items Addressed This Visit   ? ?  ? Genitourinary  ? Benign prostatic hyperplasia with post-void dribbling  ? Relevant Medications  ? tamsulosin (FLOMAX) 0.4 MG CAPS capsule  ? ?Other Visit Diagnoses   ? ? Torticollis    -  Primary  ? Will get him into PT. Continue baclofen. Start gabapentin. Will likely need MRI. Await PT's input.   ? Relevant Medications  ? ketorolac (TORADOL) injection 60 mg (Completed)  ? Other Relevant Orders  ? Ambulatory referral to Physical Therapy  ? Chronic midline thoracic back pain      ? Will get him into PT. Continue baclofen. Start gabapentin. Will likely need MRI. Await PT's input.   ? Relevant Medications  ? ketorolac (TORADOL) injection 60 mg (Completed)  ? gabapentin (NEURONTIN) 100 MG capsule  ? Other Relevant Orders  ? Ambulatory referral to Physical Therapy  ? Urinary urgency      ? UA  clear. Will increase his flomax to 0.'8mg'$  and recheck 1 month.   ? Relevant Orders  ? Urinalysis, Routine w reflex microscopic (Completed)  ? ?  ?  ? ?Follow up plan: ?Return in about 4 weeks (around 05/31/2022). ? ? ? ? ? ?

## 2022-05-11 ENCOUNTER — Other Ambulatory Visit: Payer: Self-pay | Admitting: Family Medicine

## 2022-05-11 NOTE — Telephone Encounter (Signed)
Requested Prescriptions  Pending Prescriptions Disp Refills  . baclofen (LIORESAL) 10 MG tablet [Pharmacy Med Name: BACLOFEN 10MG TABLETS] 30 tablet     Sig: TAKE 1 TABLET(10 MG) BY MOUTH AT BEDTIME AS NEEDED FOR MUSCLE SPASMS     Analgesics:  Muscle Relaxants - baclofen Passed - 05/11/2022 11:37 AM      Passed - Cr in normal range and within 180 days    Creat  Date Value Ref Range Status  01/23/2019 0.86 0.60 - 1.35 mg/dL Final   Creatinine, Ser  Date Value Ref Range Status  03/07/2022 1.00 0.76 - 1.27 mg/dL Final         Passed - eGFR is 30 or above and within 180 days    GFR, Est African American  Date Value Ref Range Status  01/23/2019 120 > OR = 60 mL/min/1.57m Final   GFR calc Af Amer  Date Value Ref Range Status  04/05/2020 120 >59 mL/min/1.73 Final   GFR, Est Non African American  Date Value Ref Range Status  01/23/2019 103 > OR = 60 mL/min/1.798mFinal   GFR calc non Af Amer  Date Value Ref Range Status  04/05/2020 104 >59 mL/min/1.73 Final   eGFR  Date Value Ref Range Status  03/07/2022 92 >59 mL/min/1.73 Final         Passed - Valid encounter within last 6 months    Recent Outpatient Visits          1 week ago Torticollis   [facility]   3 weeks ago [facility] midline thoracic [facility]   [facility]   1 month ago Cervicogenic headache   [facility] [place] [place]   2 months ago [place] mellitus without complication [place]  [facility] [place]   4 months ago Diabetes mellitus without complication ([facility]  [facility] Vigg, [name], MD      Future Appointments            In 2 months [facility] [place]

## 2022-07-31 ENCOUNTER — Encounter: Payer: Self-pay | Admitting: Dermatology

## 2022-07-31 ENCOUNTER — Ambulatory Visit: Payer: 59 | Admitting: Dermatology

## 2022-07-31 DIAGNOSIS — D1800 Hemangioma unspecified site: Secondary | ICD-10-CM

## 2022-07-31 DIAGNOSIS — L720 Epidermal cyst: Secondary | ICD-10-CM

## 2022-07-31 DIAGNOSIS — Z1283 Encounter for screening for malignant neoplasm of skin: Secondary | ICD-10-CM

## 2022-07-31 DIAGNOSIS — Z86018 Personal history of other benign neoplasm: Secondary | ICD-10-CM

## 2022-07-31 DIAGNOSIS — L409 Psoriasis, unspecified: Secondary | ICD-10-CM

## 2022-07-31 DIAGNOSIS — Z86006 Personal history of melanoma in-situ: Secondary | ICD-10-CM

## 2022-07-31 DIAGNOSIS — D229 Melanocytic nevi, unspecified: Secondary | ICD-10-CM

## 2022-07-31 DIAGNOSIS — L814 Other melanin hyperpigmentation: Secondary | ICD-10-CM

## 2022-07-31 DIAGNOSIS — L578 Other skin changes due to chronic exposure to nonionizing radiation: Secondary | ICD-10-CM

## 2022-07-31 DIAGNOSIS — L821 Other seborrheic keratosis: Secondary | ICD-10-CM

## 2022-07-31 DIAGNOSIS — L918 Other hypertrophic disorders of the skin: Secondary | ICD-10-CM

## 2022-07-31 DIAGNOSIS — C4359 Malignant melanoma of other part of trunk: Secondary | ICD-10-CM

## 2022-07-31 MED ORDER — ZORYVE 0.3 % EX CREA: 1.0000 "application " | TOPICAL_CREAM | Freq: Every day | CUTANEOUS | 3 refills | Status: DC

## 2022-07-31 NOTE — Patient Instructions (Signed)
Due to recent changes in healthcare laws, you may see results of your pathology and/or laboratory studies on MyChart before the doctors have had a chance to review them. We understand that in some cases there may be results that are confusing or concerning to you. Please understand that not all results are received at the same time and often the doctors may need to interpret multiple results in order to provide you with the best plan of care or course of treatment. Therefore, we ask that you please give us 2 business days to thoroughly review all your results before contacting the office for clarification. Should we see a critical lab result, you will be contacted sooner.   If You Need Anything After Your Visit  If you have any questions or concerns for your doctor, please call our main line at [phone] and press option 4 to reach your doctor's medical assistant. If no one answers, please leave a voicemail as directed and we will return your call as soon as possible. Messages left after 4 pm will be answered the following business day.   You may also send us a message via MyChart. We typically respond to MyChart messages within 1-2 business days.  For prescription refills, please ask your pharmacy to contact our office. Our fax number is [phone].  If you have an urgent issue when the clinic is closed that cannot wait until the next business day, you can page your doctor at the number below.    Please note that while we do our best to be available for urgent issues outside of office hours, we are not available 24/7.   If you have an urgent issue and are unable to reach us, you may choose to seek medical care at your doctor's office, retail clinic, urgent care center, or emergency room.  If you have a medical emergency, please immediately call 911 or go to the emergency department.  Pager Numbers  - Dr. Kowalski: [phone]  - Dr. Moye: [phone]  - Dr. Stewart:  [phone]  In the event of inclement weather, please call our main line at [phone] for an update on the status of any delays or closures.  Dermatology Medication Tips: Please keep the boxes that topical medications come in in order to help keep track of the instructions about where and how to use these. Pharmacies typically print the medication instructions only on the boxes and not directly on the medication tubes.   If your medication is too expensive, please contact our office at [phone] option 4 or send us a message through MyChart.   We are unable to tell what your co-pay for medications will be in advance as this is different depending on your insurance coverage. However, we may be able to find a substitute medication at lower cost or fill out paperwork to get insurance to cover a needed medication.   If a prior authorization is required to get your medication covered by your insurance company, please allow us 1-2 business days to complete this process.  Drug prices often vary depending on where the prescription is filled and some pharmacies may offer cheaper prices.  The website www.goodrx.com contains coupons for medications through different pharmacies. The prices here do not account for what the cost may be with help from insurance (it may be cheaper with your insurance), but the website can give you the price if you did not use any insurance.  - You can print the associated coupon and take it with   your prescription to the pharmacy.  - You may also stop by our office during regular business hours and pick up a GoodRx coupon card.  - If you need your prescription sent electronically to a different pharmacy, notify our office through [place] MyChart or by phone at [phone] option 4.     Si Usted Necesita Algo Despus de Su Visita  Tambin puede enviarnos un mensaje a travs de MyChart. Por lo general respondemos a los mensajes de MyChart en el transcurso de 1 a 2  das hbiles.  Para renovar recetas, por favor pida a su farmacia que se ponga en contacto con nuestra oficina. Nuestro nmero de fax es el [phone].  Si tiene un asunto urgente cuando la clnica est cerrada y que no puede esperar hasta el siguiente da hbil, puede llamar/localizar a su doctor(a) al nmero que aparece a continuacin.   Por favor, tenga en cuenta que aunque hacemos todo lo posible para estar disponibles para asuntos urgentes fuera del horario de oficina, no estamos disponibles las 24 horas del da, los 7 das de la semana.   Si tiene un problema urgente y no puede comunicarse con nosotros, puede optar por buscar atencin mdica  en el consultorio de su doctor(a), en una clnica privada, en un centro de atencin urgente o en una sala de emergencias.  Si tiene una emergencia mdica, por favor llame inmediatamente al 911 o vaya a la sala de emergencias.  Nmeros de bper  - Dr. Kowalski: [phone]  - Dra. Moye: [phone]  - Dra. Stewart: [phone]  En caso de inclemencias del tiempo, por favor llame a nuestra lnea principal al [phone] para una actualizacin sobre el estado de cualquier retraso o cierre.  Consejos para la medicacin en dermatologa: Por favor, guarde las cajas en las que vienen los medicamentos de uso tpico para ayudarle a seguir las instrucciones sobre dnde y cmo usarlos. Las farmacias generalmente imprimen las instrucciones del medicamento slo en las cajas y no directamente en los tubos del medicamento.   Si su medicamento es muy caro, por favor, pngase en contacto con nuestra oficina llamando al [phone] y presione la opcin 4 o envenos un mensaje a travs de MyChart.   No podemos decirle cul ser su copago por los medicamentos por adelantado ya que esto es diferente dependiendo de la cobertura de su seguro. Sin embargo, es posible que podamos encontrar un medicamento sustituto a menor costo o llenar un formulario para que el  seguro cubra el medicamento que se considera necesario.   Si se requiere una autorizacin previa para que su compaa de seguros cubra su medicamento, por favor permtanos de 1 a 2 das hbiles para completar este proceso.  Los precios de los medicamentos varan con frecuencia dependiendo del lugar de dnde se surte la receta y alguna farmacias pueden ofrecer precios ms baratos.  El sitio web www.goodrx.com tiene cupones para medicamentos de diferentes farmacias. Los precios aqu no tienen en cuenta lo que podra costar con la ayuda del seguro (puede ser ms barato con su seguro), pero el sitio web puede darle el precio si no utiliz ningn seguro.  - Puede imprimir el cupn correspondiente y llevarlo con su receta a la farmacia.  - Tambin puede pasar por nuestra oficina durante el horario de atencin regular y recoger una tarjeta de cupones de GoodRx.  - Si necesita que su receta se enve electrnicamente a una farmacia diferente, informe a nuestra oficina a travs de MyChart de [place]   o por telfono llamando al [phone] y presione la opcin 4.  

## 2022-07-31 NOTE — Progress Notes (Signed)
Follow-Up Visit   Subjective  Ethan Perez is a 51 y.o. male who presents for the following: Annual Exam (History of Melanoma in situ and dysplastic nevus - The patient presents for Total-Body Skin Exam (TBSE) for skin cancer screening and mole check.  The patient has spots, moles and lesions to be evaluated, some may be new or changing and the patient has concerns that these could be cancer./).  The following portions of the chart were reviewed this encounter and updated as appropriate:   Tobacco  Allergies  Meds  Problems  Med Hx  Surg Hx  Fam Hx     Review of Systems:  No other skin or systemic complaints except as noted in HPI or Assessment and Plan.  Objective  Well appearing patient in no apparent distress; mood and affect are within normal limits.  A full examination was performed including scalp, head, eyes, ears, nose, lips, neck, chest, axillae, abdomen, back, buttocks, bilateral upper extremities, bilateral lower extremities, hands, feet, fingers, toes, fingernails, and toenails. All findings within normal limits unless otherwise noted below.  Plaques of bilateral elbows       Right lat pectoral, left medial infrapectoral 0.5 cm cystic papule of right lat pectoral. 0.4 cm cystic papule of left medial infrapectoral.   Assessment & Plan   History of Melanoma in Situ - No evidence of recurrence today - Recommend regular full body skin exams - Recommend daily broad spectrum sunscreen SPF 30+ to sun-exposed areas, reapply every 2 hours as needed.  - Call if any new or changing lesions are noted between office visits  History of Dysplastic Nevi - No evidence of recurrence today - Recommend regular full body skin exams - Recommend daily broad spectrum sunscreen SPF 30+ to sun-exposed areas, reapply every 2 hours as needed.  - Call if any new or changing lesions are noted between office visits  Acrochordons (Skin Tags) - Fleshy, skin-colored pedunculated  papules - Benign appearing.  - Observe. - If desired, they can be removed with an in office procedure that is not covered by insurance. - Please call the clinic if you notice any new or changing lesions.  Lentigines - Scattered tan macules - Due to sun exposure - Benign-appearing, observe - Recommend daily broad spectrum sunscreen SPF 30+ to sun-exposed areas, reapply every 2 hours as needed. - Call for any changes  Seborrheic Keratoses - Stuck-on, waxy, tan-brown papules and/or plaques  - Benign-appearing - Discussed benign etiology and prognosis. - Observe - Call for any changes  Melanocytic Nevi - Tan-brown and/or pink-flesh-colored symmetric macules and papules - Benign appearing on exam today - Observation - Call clinic for new or changing moles - Recommend daily use of broad spectrum spf 30+ sunscreen to sun-exposed areas.   Hemangiomas - Red papules - Discussed benign nature - Observe - Call for any changes  Actinic Damage - Chronic condition, secondary to cumulative UV/sun exposure - diffuse scaly erythematous macules with underlying dyspigmentation - Recommend daily broad spectrum sunscreen SPF 30+ to sun-exposed areas, reapply every 2 hours as needed.  - Staying in the shade or wearing long sleeves, sun glasses (UVA+UVB protection) and wide brim hats (4-inch brim around the entire circumference of the hat) are also recommended for sun protection.  - Call for new or changing lesions.  Skin cancer screening performed today.  Epidermal inclusion cyst Right lat pectoral, left medial infrapectoral Discussed excision. Patient may consider. Benign-appearing. Exam most consistent with an epidermal inclusion cyst. Discussed that  a cyst is a benign growth that can grow over time and sometimes get irritated or inflamed. Recommend observation if it is not bothersome. Discussed option of surgical excision to remove it if it is growing, symptomatic, or other changes noted.  Please call for new or changing lesions so they can be evaluated.  Psoriasis Elbows. Psoriasis is a chronic non-curable, but treatable genetic/hereditary disease that may have other systemic features affecting other organ systems such as joints (Psoriatic Arthritis). It is associated with an increased risk of inflammatory bowel disease, heart disease, non-alcoholic fatty liver disease, and depression.   Chronic and persistent condition with duration or expected duration over one year. Condition is bothersome/symptomatic for patient. Currently flared.   Discussed joint aches in knees and they seem more consistent with sports injury/osteoarthritis. May consider evaluation by rheumatologist in the future.  Start Zoryve cream qd-bid  Roflumilast (ZORYVE) 0.3 % CREA Apply 1 application  topically daily.  Return in about 6 months (around 01/31/2023) for TBSE history of Melanoma in situ.  I, Ashok Cordia, CMA, am acting as scribe for Sarina Ser, MD . Documentation: I have reviewed the above documentation for accuracy and completeness, and I agree with the above.  Sarina Ser, MD

## 2022-08-10 ENCOUNTER — Encounter: Payer: Self-pay | Admitting: Family Medicine

## 2022-08-10 ENCOUNTER — Ambulatory Visit: Payer: 59 | Admitting: Family Medicine

## 2022-08-10 VITALS — BP 110/74 | HR 92 | Temp 98.2°F | Ht 75.0 in | Wt 284.0 lb

## 2022-08-10 DIAGNOSIS — E7849 Other hyperlipidemia: Secondary | ICD-10-CM | POA: Diagnosis not present

## 2022-08-10 DIAGNOSIS — E1169 Type 2 diabetes mellitus with other specified complication: Secondary | ICD-10-CM

## 2022-08-10 DIAGNOSIS — K219 Gastro-esophageal reflux disease without esophagitis: Secondary | ICD-10-CM

## 2022-08-10 DIAGNOSIS — R252 Cramp and spasm: Secondary | ICD-10-CM

## 2022-08-10 DIAGNOSIS — N3943 Post-void dribbling: Secondary | ICD-10-CM

## 2022-08-10 DIAGNOSIS — Z1159 Encounter for screening for other viral diseases: Secondary | ICD-10-CM

## 2022-08-10 DIAGNOSIS — N401 Enlarged prostate with lower urinary tract symptoms: Secondary | ICD-10-CM

## 2022-08-10 LAB — BAYER DCA HB A1C WAIVED: HB A1C (BAYER DCA - WAIVED): 7 % — ABNORMAL HIGH (ref 4.8–5.6)

## 2022-08-10 MED ORDER — GABAPENTIN 100 MG PO CAPS
100.0000 mg | ORAL_CAPSULE | Freq: Every day | ORAL | 1 refills | Status: DC
Start: 2022-08-10 — End: 2023-03-22

## 2022-08-10 MED ORDER — BACLOFEN 10 MG PO TABS
ORAL_TABLET | ORAL | 1 refills | Status: DC
Start: 2022-08-10 — End: 2023-06-18

## 2022-08-10 MED ORDER — TAMSULOSIN HCL 0.4 MG PO CAPS: 0.8000 mg | ORAL_CAPSULE | Freq: Every day | ORAL | 0 refills | Status: DC

## 2022-08-10 NOTE — Assessment & Plan Note (Signed)
Under good control on current regimen. Continue current regimen. Continue to monitor. Call with any concerns. Refills given. Labs drawn today.   

## 2022-08-10 NOTE — Assessment & Plan Note (Addendum)
Doing well with a1c of 6.0 off medicine. Will continue off medicine and receck 6 months. Call with any concerns.

## 2022-08-10 NOTE — Progress Notes (Signed)
BP 110/74   Pulse 92   Temp 98.2 F (36.8 C)   Ht '6\' 3"'$  (1.905 m)   Wt 284 lb (128.8 kg)   SpO2 97%   BMI 35.50 kg/m    Subjective:    Patient ID: Ethan Perez, male    DOB: February 06, 1971, 51 y.o.   MRN: [id number]  HPI: Ethan Perez is a 50 y.o. male  Chief Complaint  Patient presents with   Diabetes    Patient states he has not had eye exam this year    Gastroesophageal Reflux   Hyperlipidemia   DIABETES- has been off all his medicine for about 6 months.  Hypoglycemic episodes:yes Polydipsia/polyuria: no Visual disturbance: no Chest pain: no Paresthesias: no Glucose Monitoring: yes  Accucheck frequency:  occasionally Taking Insulin?: no Blood Pressure Monitoring: not checking Retinal Examination: Not up to Date Foot Exam: Not up to Date Diabetic Education: Completed Pneumovax: Up to Date Influenza: Up to Date Aspirin: no  LEG CRAMPS Duration: months Pain: yes Severity: moderate  Quality:  cramping Location:  arms, legs, generalized Bilateral:  yes Onset: sudden Frequency: intermittent Time of  day:   at random Sudden unintentional leg jerking:   no Paresthesias:   no Decreased sensation:  no Weakness:   no Insomnia:   no Fatigue:   no Status: worse  GERD GERD control status: controlled Satisfied with current treatment? yes Heartburn frequency: rarely Medication side effects: no  Medication compliance: excellent Dysphagia: no Odynophagia:  no Hematemesis: no Blood in stool: no EGD: no  HYPERLIPIDEMIA Hyperlipidemia status: excellent compliance Satisfied with current treatment?  yes Side effects:  no Medication compliance: excellent compliance Past cholesterol meds: none Supplements: none Aspirin:  no The 10-year ASCVD risk score (Arnett DK, et al., 2019) is: 7.7%   Values used to calculate the score:     Age: 25 years     Sex: Male     Is Non-Hispanic African American: No     Diabetic: Yes     Tobacco smoker: No      Systolic Blood Pressure: 160 mmHg     Is BP treated: No     HDL Cholesterol: 39 mg/dL     Total Cholesterol: 225 mg/dL Chest pain:  no   Relevant past medical, surgical, family and social history reviewed and updated as indicated. Interim medical history since our last visit reviewed. Allergies and medications reviewed and updated.  Review of Systems  Constitutional: Negative.   Respiratory: Negative.    Cardiovascular: Negative.   Gastrointestinal: Negative.   Musculoskeletal: Negative.   Neurological: Negative.   Psychiatric/Behavioral: Negative.      Per HPI unless specifically indicated above     Objective:    BP 110/74   Pulse 92   Temp 98.2 F (36.8 C)   Ht '6\' 3"'$  (1.905 m)   Wt 284 lb (128.8 kg)   SpO2 97%   BMI 35.50 kg/m   Wt Readings from Last 3 Encounters:  08/10/22 284 lb (128.8 kg)  05/03/22 284 lb (128.8 kg)  04/16/22 287 lb (130.2 kg)    Physical Exam Vitals and nursing note reviewed.  Constitutional:      General: He is not in acute distress.    Appearance: Normal appearance. He is not ill-appearing, toxic-appearing or diaphoretic.  HENT:     Head: Normocephalic and atraumatic.     Right Ear: External ear normal.     Left Ear: External ear normal.     Nose:  Nose normal.     Mouth/Throat:     Mouth: Mucous membranes are moist.     Pharynx: Oropharynx is clear.  Eyes:     General: No scleral icterus.       Right eye: No discharge.        Left eye: No discharge.     Extraocular Movements: Extraocular movements intact.     Conjunctiva/sclera: Conjunctivae normal.     Pupils: Pupils are equal, round, and reactive to light.  Cardiovascular:     Rate and Rhythm: Normal rate and regular rhythm.     Pulses: Normal pulses.     Heart sounds: Normal heart sounds. No murmur heard.    No friction rub. No gallop.  Pulmonary:     Effort: Pulmonary effort is normal. No respiratory distress.     Breath sounds: Normal breath sounds. No stridor. No  wheezing, rhonchi or rales.  Chest:     Chest wall: No tenderness.  Musculoskeletal:        General: Normal range of motion.     Cervical back: Normal range of motion and neck supple.  Skin:    General: Skin is warm and dry.     Capillary Refill: Capillary refill takes less than 2 seconds.     Coloration: Skin is not jaundiced or pale.     Findings: No bruising, erythema, lesion or rash.  Neurological:     General: No focal deficit present.     Mental Status: He is alert and oriented to person, place, and time. Mental status is at baseline.  Psychiatric:        Mood and Affect: Mood normal.        Behavior: Behavior normal.        Thought Content: Thought content normal.        Judgment: Judgment normal.     Results for orders placed or performed in visit on 08/10/22  Bayer DCA Hb A1c Waived  Result Value Ref Range   HB A1C (BAYER DCA - WAIVED) 7.0 (H) 4.8 - 5.6 %      Assessment & Plan:   Problem List Items Addressed This Visit       Digestive   Gastroesophageal reflux disease    Under good control on current regimen. Continue current regimen. Continue to monitor. Call with any concerns. Refills given. Labs drawn today.       Relevant Orders   Comprehensive metabolic panel   CBC with Differential/Platelet     Endocrine   Type 2 diabetes mellitus with other specified complication ([facility]) - Primary    Doing well with a1c of 6.0 off medicine. Will continue off medicine and receck 6 months. Call with any concerns.       Relevant Orders   Comprehensive metabolic panel   CBC with Differential/Platelet   Bayer DCA Hb A1c Waived (Completed)     Genitourinary   Benign prostatic hyperplasia with post-void dribbling    Under good control on current regimen. Continue current regimen. Continue to monitor. Call with any concerns. Refills given. Labs drawn today.       Relevant Medications   tamsulosin (FLOMAX) 0.4 MG CAPS capsule     Other   Hyperlipidemia (Chronic)     Under good control on current regimen. Continue current regimen. Continue to monitor. Call with any concerns. Refills given. Labs drawn today.       Relevant Orders   Comprehensive metabolic panel   CBC with Differential/Platelet  Lipid Panel w/o Chol/HDL Ratio   Other Visit Diagnoses     Leg cramps       Will check labs today and await results. Discussed hydration/gatorade and tonic water. Call if not getting better or getting worse.    Relevant Orders   VITAMIN D 25 Hydroxy (Vit-D Deficiency, Fractures)   Magnesium   Phosphorus   Need for hepatitis C screening test       Labs drawn today. Await results.    Relevant Orders   Hepatitis C Antibody        Follow up plan: Return in about 6 months (around 02/10/2023) for physical.

## 2022-08-11 LAB — CBC WITH DIFFERENTIAL/PLATELET
Basophils Absolute: 0.1 10*3/uL (ref 0.0–0.2)
Basos: 1 %
EOS (ABSOLUTE): 0.2 10*3/uL (ref 0.0–0.4)
Eos: 4 %
Hematocrit: 50.2 % (ref 37.5–51.0)
Hemoglobin: 16.5 g/dL (ref 13.0–17.7)
Immature Grans (Abs): 0 10*3/uL (ref 0.0–0.1)
Immature Granulocytes: 0 %
Lymphocytes Absolute: 2.2 10*3/uL (ref 0.7–3.1)
Lymphs: 34 %
MCH: 29.6 pg (ref 26.6–33.0)
MCHC: 32.9 g/dL (ref 31.5–35.7)
MCV: 90 fL (ref 79–97)
Monocytes Absolute: 0.5 10*3/uL (ref 0.1–0.9)
Monocytes: 8 %
Neutrophils Absolute: 3.5 10*3/uL (ref 1.4–7.0)
Neutrophils: 53 %
Platelets: 190 10*3/uL (ref 150–450)
RBC: 5.58 x10E6/uL (ref 4.14–5.80)
RDW: 13 % (ref 11.6–15.4)
WBC: 6.5 10*3/uL (ref 3.4–10.8)

## 2022-08-11 LAB — COMPREHENSIVE METABOLIC PANEL
ALT: 26 IU/L (ref 0–44)
AST: 22 IU/L (ref 0–40)
Albumin/Globulin Ratio: 1.4 (ref 1.2–2.2)
Albumin: 4.1 g/dL (ref 4.1–5.1)
Alkaline Phosphatase: 81 IU/L (ref 44–121)
BUN/Creatinine Ratio: 11 (ref 9–20)
BUN: 11 mg/dL (ref 6–24)
Bilirubin Total: 0.4 mg/dL (ref 0.0–1.2)
CO2: 21 mmol/L (ref 20–29)
Calcium: 9.2 mg/dL (ref 8.7–10.2)
Chloride: 100 mmol/L (ref 96–106)
Creatinine, Ser: 0.99 mg/dL (ref 0.76–1.27)
Globulin, Total: 3 g/dL (ref 1.5–4.5)
Glucose: 180 mg/dL — ABNORMAL HIGH (ref 70–99)
Potassium: 4 mmol/L (ref 3.5–5.2)
Sodium: 137 mmol/L (ref 134–144)
Total Protein: 7.1 g/dL (ref 6.0–8.5)
eGFR: 93 mL/min/{1.73_m2} (ref >59)

## 2022-08-11 LAB — PHOSPHORUS: Phosphorus: 3.4 mg/dL (ref 2.8–4.1)

## 2022-08-11 LAB — LIPID PANEL W/O CHOL/HDL RATIO
Cholesterol, Total: 219 mg/dL — ABNORMAL HIGH (ref 100–199)
HDL: 35 mg/dL — ABNORMAL LOW (ref >39)
LDL Chol Calc (NIH): 153 mg/dL — ABNORMAL HIGH (ref 0–99)
Triglycerides: 171 mg/dL — ABNORMAL HIGH (ref 0–149)
VLDL Cholesterol Cal: 31 mg/dL (ref 5–40)

## 2022-08-11 LAB — VITAMIN D 25 HYDROXY (VIT D DEFICIENCY, FRACTURES): Vit D, 25-Hydroxy: 26.1 ng/mL — ABNORMAL LOW (ref 30.0–100.0)

## 2022-08-11 LAB — HEPATITIS C ANTIBODY: Hep C Virus Ab: NONREACTIVE

## 2022-08-11 LAB — MAGNESIUM: Magnesium: 1.7 mg/dL (ref 1.6–2.3)

## 2022-12-03 ENCOUNTER — Ambulatory Visit: Payer: Self-pay

## 2022-12-03 ENCOUNTER — Other Ambulatory Visit: Payer: Self-pay | Admitting: Nurse Practitioner

## 2022-12-03 DIAGNOSIS — M25562 Pain in left knee: Secondary | ICD-10-CM

## 2022-12-21 ENCOUNTER — Ambulatory Visit: Payer: Self-pay

## 2022-12-21 NOTE — Telephone Encounter (Signed)
  Chief Complaint: sore throat Symptoms: sore throat, chills, sneezing, sinus pressure, cough  Frequency: last night  Pertinent Negatives: NA Disposition: '[]'$ ED /'[]'$ Urgent Care (no appt availability in office) / '[]'$ Appointment(In office/virtual)/ '[x]'$  [facility]/ '[]'$ Home Care/ '[]'$ Refused Recommended Disposition /'[]'$ [place] [facility]/ '[]'$  Follow-up with PCP Additional Notes: wife tested positive for strep on Wednesday. Advised pt of no appts, scheduled virtual UC tomorrow at 1200.   Summary: Sore throat, chills, fatigue advice   Pt is calling to report a sore throat, chills, fatique. No available available appts. Please advise         Reason for Disposition  [1] Strep throat EXPOSURE within past 10 days AND [2] sore throat  Answer Assessment - Initial Assessment Questions 1. STREP EXPOSURE: "Was the exposure to someone who lives within your home?" If not, ask: "How much contact did you have with the sick person?"      Yes wife  2. ONSET: "How many days ago did the contact occur?"      Wednesday 3. PROVEN STREP: "Are you sure the person with strep had a positive throat culture or rapid strep test?"      Yes went to PCP and was tested  4. STREP SYMPTOMS: "Do YOU have a sore throat, fever, or other symptoms suggestive of strep?"      Sore throat, chills, sneezing, sinus pressure  5. VIRAL SYMPTOMS: "Are there any symptoms of a cold, such as a runny nose, cough, hoarse voice?"     yes  Protocols used: Strep Throat Exposure-A-AH

## 2022-12-22 ENCOUNTER — Telehealth: Payer: 59 | Admitting: Physician Assistant

## 2022-12-22 DIAGNOSIS — J020 Streptococcal pharyngitis: Secondary | ICD-10-CM | POA: Diagnosis not present

## 2022-12-22 MED ORDER — AMOXICILLIN 500 MG PO CAPS: 500.0000 mg | ORAL_CAPSULE | Freq: Two times a day (BID) | ORAL | 0 refills | Status: DC

## 2022-12-22 MED ORDER — AMOXICILLIN-POT CLAVULANATE 875-125 MG PO TABS: 1.0000 | ORAL_TABLET | Freq: Two times a day (BID) | ORAL | 0 refills | Status: DC

## 2022-12-22 NOTE — Progress Notes (Signed)
Virtual Visit Consent   [name], you are scheduled for a virtual visit with a [facility] provider today. Just as with appointments in the office, your consent must be obtained to participate. Your consent will be active for this visit and any virtual visit you may have with one of our providers in the next 365 days. If you have a MyChart account, a copy of this consent can be sent to you electronically.  As this is a virtual visit, video technology does not allow for your provider to perform a traditional examination. This may limit your provider's ability to fully assess your condition. If your provider identifies any concerns that need to be evaluated in person or the need to arrange testing (such as labs, EKG, etc.), we will make arrangements to do so. Although advances in technology are sophisticated, we cannot ensure that it will always work on either your end or our end. If the connection with a video visit is poor, the visit may have to be switched to a telephone visit. With either a video or telephone visit, we are not always able to ensure that we have a secure connection.  By engaging in this virtual visit, you consent to the provision of healthcare and authorize for your insurance to be billed (if applicable) for the services provided during this visit. Depending on your insurance coverage, you may receive a charge related to this service.  I need to obtain your verbal consent now. Are you willing to proceed with your visit today? Ethan Perez has provided verbal consent on 12/22/2022 for a virtual visit (video or telephone). Ethan Daring, PA-C  Date: 12/22/2022 11:53 AM  Virtual Visit via Video Note   I, Ethan Perez, connected with  Ethan Perez  ([id number], 03/18/1971) on 12/22/22 at 12:00 PM EST by a video-enabled telemedicine application and verified that I am speaking with the correct person using two identifiers.  Location: Patient: Virtual  Visit Location Patient: Home Provider: Virtual Visit Location Provider: Home Office   I discussed the limitations of evaluation and management by telemedicine and the availability of in person appointments. The patient expressed understanding and agreed to proceed.    History of Present Illness: Ethan Perez is a 51 y.o. who identifies as a male who was assigned male at birth, and is being seen today for sore throat.  HPI: Sore Throat  This is a new problem. The current episode started in the past 7 days (Thursday). The problem has been gradually worsening. Maximum temperature: 99.7. The fever has been present for 1 to 2 days. The pain is moderate. Associated symptoms include congestion, coughing, headaches, a hoarse voice and swollen glands. Pertinent negatives include no ear discharge, ear pain, plugged ear sensation, shortness of breath or trouble swallowing. Associated symptoms comments: Myalgias, fatigue. He has had exposure to strep. Exposure to: father. He has tried acetaminophen and NSAIDs for the symptoms. The treatment provided moderate relief.     Problems:  Patient Active Problem List   Diagnosis Date Noted   DJD (degenerative joint disease) of cervical spine 04/16/2022   Benign prostatic hyperplasia with post-void dribbling 04/05/2020   Acute bursitis of right shoulder 01/06/2020   Gastroesophageal reflux disease 01/06/2020   History of melanoma 01/06/2020   Reactive airway disease 01/06/2020   History of sinus cancer 01/06/2020   Psoriasis 06/21/2016   Migraines    Type 2 diabetes mellitus with other specified complication (HCC)    Morbid obesity (  [employer])    Hyperlipidemia     Allergies:  Allergies  Allergen Reactions   Cat Hair Extract Shortness Of Breath    Rabbits, [place] pigs   Other Swelling    [place] nuts   Prochlorperazine    Tramadol Other (See Comments)    Sleep for 24 hr.   Compazine [Prochlorperazine Edisylate] Other (See Comments)    Causes  involuntary muscle movements.   Medications:  Current Outpatient Medications:    amoxicillin-clavulanate (AUGMENTIN) 875-125 MG tablet, Take 1 tablet by mouth 2 (two) times daily., Disp: 20 tablet, Rfl: 0   albuterol (PROVENTIL HFA;VENTOLIN HFA) 108 (90 Base) MCG/ACT inhaler, Inhale 1-2 puffs into the lungs every 6 (six) hours as needed for wheezing or shortness of breath., Disp: 1 Inhaler, Rfl: 0   aspirin-acetaminophen-caffeine (EXCEDRIN MIGRAINE) 250-250-65 MG tablet, Take 2 tablets by mouth every 6 (six) hours as needed for headache., Disp: , Rfl:    baclofen (LIORESAL) 10 MG tablet, TAKE 1 TABLET(10 MG) BY MOUTH AT BEDTIME AS NEEDED FOR MUSCLE SPASMS, Disp: 90 each, Rfl: 1   Blood Glucose Monitoring Suppl (ONE TOUCH ULTRA 2) w/Device KIT, SMARTSIG:1 Each Via Meter As Directed, Disp: , Rfl:    gabapentin (NEURONTIN) 100 MG capsule, Take 1 capsule (100 mg total) by mouth at bedtime., Disp: 90 capsule, Rfl: 1   glucose blood (ONETOUCH ULTRA) test strip, Use to check blood sugar twice a day., Disp: 100 each, Rfl: 1   Roflumilast (ZORYVE) 0.3 % CREA, Apply 1 application  topically daily. (Patient not taking: Reported on 08/10/2022), Disp: 60 g, Rfl: 3   tamsulosin (FLOMAX) 0.4 MG CAPS capsule, Take 2 capsules (0.8 mg total) by mouth daily., Disp: 180 capsule, Rfl: 0  Observations/Objective: Patient is well-developed, well-nourished in no acute distress.  Resting comfortably at home.  Head is normocephalic, atraumatic.  No labored breathing.  Speech is clear and coherent with logical content.  Patient is alert and oriented at baseline.    Assessment and Plan: 1. Strep throat - amoxicillin-clavulanate (AUGMENTIN) 875-125 MG tablet; Take 1 tablet by mouth 2 (two) times daily.  Dispense: 20 tablet; Refill: 0  - Suspect strep throat - Augmentin prescribed - Tylenol and Ibuprofen alternating every 4 hours - Salt water gargles - Chloraseptic spray - Liquid and soft food diet - Push fluids -  New toothbrush in 3 days - Seek in person evaluation if not improving or if symptoms worsen   Follow Up Instructions: I discussed the assessment and treatment plan with the patient. The patient was provided an opportunity to ask questions and all were answered. The patient agreed with the plan and demonstrated an understanding of the instructions.  A copy of instructions were sent to the patient via MyChart unless otherwise noted below.    The patient was advised to call back or seek an in-person evaluation if the symptoms worsen or if the condition fails to improve as anticipated.  Time:  I spent 10 minutes with the patient via telehealth technology discussing the above problems/concerns.    Ethan Daring, PA-C

## 2022-12-22 NOTE — Patient Instructions (Signed)
Ethan Perez, thank you for joining Mar Daring, PA-C for today's virtual visit.  While this provider is not your primary care provider (PCP), if your PCP is located in our provider database this encounter information will be shared with them immediately following your visit.   [facility] account gives you access to today's visit and all your visits, tests, and labs performed at [facility] " click here if you don't have a [facility] account or go to [email or web address]  Consent: (Patient) Ethan Perez provided verbal consent for this virtual visit at the beginning of the encounter.  Current Medications:  Current Outpatient Medications:    amoxicillin-clavulanate (AUGMENTIN) 875-125 MG tablet, Take 1 tablet by mouth 2 (two) times daily., Disp: 20 tablet, Rfl: 0   albuterol (PROVENTIL HFA;VENTOLIN HFA) 108 (90 Base) MCG/ACT inhaler, Inhale 1-2 puffs into the lungs every 6 (six) hours as needed for wheezing or shortness of breath., Disp: 1 Inhaler, Rfl: 0   aspirin-acetaminophen-caffeine (EXCEDRIN MIGRAINE) 250-250-65 MG tablet, Take 2 tablets by mouth every 6 (six) hours as needed for headache., Disp: , Rfl:    baclofen (LIORESAL) 10 MG tablet, TAKE 1 TABLET(10 MG) BY MOUTH AT BEDTIME AS NEEDED FOR MUSCLE SPASMS, Disp: 90 each, Rfl: 1   Blood Glucose Monitoring Suppl (ONE TOUCH ULTRA 2) w/Device KIT, SMARTSIG:1 Each Via Meter As Directed, Disp: , Rfl:    gabapentin (NEURONTIN) 100 MG capsule, Take 1 capsule (100 mg total) by mouth at bedtime., Disp: 90 capsule, Rfl: 1   glucose blood (ONETOUCH ULTRA) test strip, Use to check blood sugar twice a day., Disp: 100 each, Rfl: 1   Roflumilast (ZORYVE) 0.3 % CREA, Apply 1 application  topically daily. (Patient not taking: Reported on 08/10/2022), Disp: 60 g, Rfl: 3   tamsulosin (FLOMAX) 0.4 MG CAPS capsule, Take 2 capsules (0.8 mg total) by mouth daily., Disp: 180 capsule, Rfl: 0   Medications  ordered in this encounter:  Meds ordered this encounter  Medications   DISCONTD: amoxicillin (AMOXIL) 500 MG capsule    Sig: Take 1 capsule (500 mg total) by mouth 2 (two) times daily for 10 days.    Dispense:  20 capsule    Refill:  0    Order Specific Question:   Supervising Provider    Answer:   Chase Picket [id number]   amoxicillin-clavulanate (AUGMENTIN) 875-125 MG tablet    Sig: Take 1 tablet by mouth 2 (two) times daily.    Dispense:  20 tablet    Refill:  0    Change amox to augmentin please    Order Specific Question:   Supervising Provider    Answer:   Chase Picket [[id number]]     *If you need refills on other medications prior to your next appointment, please contact your pharmacy*  Follow-Up: Call back or seek an in-person evaluation if the symptoms worsen or if the condition fails to improve as anticipated.  [facility] [phone]  Other Instructions  Strep Throat, Adult Strep throat is an infection in the throat that is caused by bacteria. It is common during the cold months of the year. It mostly affects children who are 85-3 years old. However, people of all ages can get it at any time of the year. This infection spreads from person to person (is contagious) through coughing, sneezing, or having close contact. Your health care provider may use other names to describe the infection. When strep throat affects  the tonsils, it is called tonsillitis. When it affects the back of the throat, it is called pharyngitis. What are the causes? This condition is caused by the Streptococcus pyogenes bacteria. What increases the risk? You are more likely to develop this condition if: You care for school-age children, or are around school-age children. Children are more likely to get strep throat and may spread it to others. You spend time in crowded places where the infection can spread easily. You have close contact with someone who has strep throat. What  are the signs or symptoms? Symptoms of this condition include: Fever or chills. Redness, swelling, or pain in the tonsils or throat. Pain or difficulty when swallowing. White or yellow spots on the tonsils or throat. Tender glands in the neck and under the jaw. Bad smelling breath. Red rash all over the body. This is rare. How is this diagnosed? This condition is diagnosed by tests that check for the presence and the amount of bacteria that cause strep throat. They are: Rapid strep test. Your throat is swabbed and checked for the presence of bacteria. Results are usually ready in minutes. Throat culture test. Your throat is swabbed. The sample is placed in a cup that allows infections to grow. Results are usually ready in 1 or 2 days. How is this treated? This condition may be treated with: Medicines that kill germs (antibiotics). Medicines that relieve pain or fever. These include: Ibuprofen or acetaminophen. Aspirin, only for people who are over the age of 57. Throat lozenges. Throat sprays. Follow these instructions at home: Medicines  Take over-the-counter and prescription medicines only as told by your health care provider. Take your antibiotic medicine as told by your health care provider. Do not stop taking the antibiotic even if you start to feel better. Eating and drinking  If you have trouble swallowing, try eating soft foods until your sore throat feels better. Drink enough fluid to keep your urine pale yellow. To help relieve pain, you may have: Warm fluids, such as soup and tea. Cold fluids, such as frozen desserts or popsicles. General instructions Gargle with a salt-water mixture 3-4 times a day or as needed. To make a salt-water mixture, completely dissolve -1 tsp (3-6 g) of salt in 1 cup (237 mL) of warm water. Get plenty of rest. Stay home from work or school until you have been taking antibiotics for 24 hours. Do not use any products that contain nicotine or  tobacco. These products include cigarettes, chewing tobacco, and vaping devices, such as e-cigarettes. If you need help quitting, ask your health care provider. It is up to you to get your test results. Ask your health care provider, or the department that is doing the test, when your results will be ready. Keep all follow-up visits. This is important. How is this prevented?  Do not share food, drinking cups, or personal items that could cause the infection to spread to other people. Wash your hands often with soap and water for at least 20 seconds. If soap and water are not available, use hand sanitizer. Make sure that all people in your house wash their hands well. Have family members tested if they have a sore throat or fever. They may need an antibiotic if they have strep throat. Contact a health care provider if: You have swelling in your neck that keeps getting bigger. You develop a rash, cough, or earache. You cough up a thick mucus that is green, yellow-brown, or bloody. You have pain  or discomfort that does not get better with medicine. Your symptoms seem to be getting worse. You have a fever. Get help right away if: You have new symptoms, such as vomiting, severe headache, stiff or painful neck, chest pain, or shortness of breath. You have severe throat pain, drooling, or changes in your voice. You have swelling of the neck, or the skin on the neck becomes red and tender. You have signs of dehydration, such as tiredness (fatigue), dry mouth, and decreased urination. You become increasingly sleepy, or you cannot wake up completely. Your joints become red or painful. These symptoms may represent a serious problem that is an emergency. Do not wait to see if the symptoms will go away. Get medical help right away. Call your local emergency services (911 in the [place] Do not drive yourself to the hospital. Summary Strep throat is an infection in the throat that is caused by the  Streptococcus pyogenes bacteria. This infection is spread from person to person (is contagious) through coughing, sneezing, or having close contact. Take your medicines, including antibiotics, as told by your health care provider. Do not stop taking the antibiotic even if you start to feel better. To prevent the spread of germs, wash your hands well with soap and water. Have others do the same. Do not share food, drinking cups, or personal items. Get help right away if you have new symptoms, such as vomiting, severe headache, stiff or painful neck, chest pain, or shortness of breath. This information is not intended to replace advice given to you by your health care provider. Make sure you discuss any questions you have with your health care provider. Document Revised: 04/04/2021 Document Reviewed: 04/04/2021 Elsevier Patient Education  [place].    If you have been instructed to have an in-person evaluation today at a local Urgent Care facility, please use the link below. It will take you to a list of all of our available [facility], including address, phone number and hours of operation. Please do not delay care.  [facility]  If you or a family member do not have a primary care provider, use the link below to schedule a visit and establish care. When you choose a [facility] primary care physician or advanced practice provider, you gain a long-term partner in health. Find a Primary Care Provider  Learn more about [facility]'s in-office and virtual care options: [facility]

## 2023-02-05 ENCOUNTER — Ambulatory Visit: Payer: 59 | Admitting: Dermatology

## 2023-02-11 ENCOUNTER — Ambulatory Visit: Payer: 59 | Admitting: Dermatology

## 2023-02-11 VITALS — BP 144/84

## 2023-02-11 DIAGNOSIS — L219 Seborrheic dermatitis, unspecified: Secondary | ICD-10-CM

## 2023-02-11 DIAGNOSIS — L814 Other melanin hyperpigmentation: Secondary | ICD-10-CM

## 2023-02-11 DIAGNOSIS — Z1283 Encounter for screening for malignant neoplasm of skin: Secondary | ICD-10-CM

## 2023-02-11 DIAGNOSIS — L578 Other skin changes due to chronic exposure to nonionizing radiation: Secondary | ICD-10-CM

## 2023-02-11 DIAGNOSIS — Z86006 Personal history of melanoma in-situ: Secondary | ICD-10-CM

## 2023-02-11 DIAGNOSIS — Z85820 Personal history of malignant melanoma of skin: Secondary | ICD-10-CM

## 2023-02-11 DIAGNOSIS — L821 Other seborrheic keratosis: Secondary | ICD-10-CM

## 2023-02-11 DIAGNOSIS — D485 Neoplasm of uncertain behavior of skin: Secondary | ICD-10-CM

## 2023-02-11 DIAGNOSIS — D229 Melanocytic nevi, unspecified: Secondary | ICD-10-CM

## 2023-02-11 DIAGNOSIS — Z86018 Personal history of other benign neoplasm: Secondary | ICD-10-CM

## 2023-02-11 MED ORDER — HYDROCORTISONE 2.5 % EX CREA: TOPICAL_CREAM | Freq: Every day | CUTANEOUS | 30 refills | Status: AC

## 2023-02-11 NOTE — Patient Instructions (Signed)
Shave Excision Benign Lesion Wound Care Instructions  Leave the original bandage on for 24 hours if possible.  If the bandage becomes soaked or soiled before that time, it is OK to remove it and examine the wound.  A small amount of post-operative bleeding is normal.  If excessive bleeding occurs, remove the bandage, place gauze over the site and apply continuous pressure (no peeking) over the area for 20-30 minutes.  If this does not stop the bleeding, try again for 40 minutes.  If this does not work, please call our clinic as soon as possible (even if after-hours).    Twice a day, cleanse the wound with soap and water.  If a thick crust develops you may use a Q-tip dipped into dilute hydrogen peroxide (mix 1:1 with water) to dissolve it.  Hydrogen peroxide can slow the healing process, so use it only as needed.  After washing, apply Vaseline jelly or Polysporin ointment.  For best healing, the wound should be covered with a layer of ointment at all times.  This may mean re-applying the ointment several times a day.  For open wounds, continue until it has healed.    If you have any swelling, keep the area elevated.  Some redness, tenderness and white or yellow material in the wound is normal healing.  If the area becomes very sore and red, or develops a thick yellow-green material (pus), it may be infected; please notify us.    Wound healing continues for up to one year following surgery.  It is not unusual to experience pain in the scar from time to time during the interval.  If the pain becomes severe or the scar thickens, you should notify the office.  A slight amount of redness in a scar is expected for the first six months.  After six months, the redness subsides and the scar will soften and fade.  The color difference becomes less noticeable with time.  If there are any problems, return for a post-op surgery check at your earliest convenience.  Please call our office for any questions or  concerns.    Due to recent changes in healthcare laws, you may see results of your pathology and/or laboratory studies on MyChart before the doctors have had a chance to review them. We understand that in some cases there may be results that are confusing or concerning to you. Please understand that not all results are received at the same time and often the doctors may need to interpret multiple results in order to provide you with the best plan of care or course of treatment. Therefore, we ask that you please give [place] 2 business days to thoroughly review all your results before contacting the office for clarification. Should we see a critical lab result, you will be contacted sooner.   If You Need Anything After Your Visit  If you have any questions or concerns for your doctor, please call our main line at [phone] and press option 4 to reach your doctor's medical assistant. If no one answers, please leave a voicemail as directed and we will return your call as soon as possible. Messages left after 4 pm will be answered the following business day.   You may also send [place] a message via [place]. We typically respond to MyChart messages within 1-2 business days.  For prescription refills, please ask your pharmacy to contact our office. Our fax number is [phone].  If you have an urgent issue when the clinic is  closed that cannot wait until the next business day, you can page your doctor at the number below.    Please note that while we do our best to be available for urgent issues outside of office hours, we are not available 24/7.   If you have an urgent issue and are unable to reach [place], you may choose to seek medical care at your doctor's office, retail clinic, urgent care center, or emergency room.  If you have a medical emergency, please immediately call 911 or go to the emergency department.  Pager Numbers  - Dr. Nehemiah Massed: [phone]  - Dr. Laurence Ferrari: [phone]  - Dr. Nicole Kindred:  [phone]  In the event of inclement weather, please call our main line at [phone] for an update on the status of any delays or closures.  Dermatology Medication Tips: Please keep the boxes that topical medications come in in order to help keep track of the instructions about where and how to use these. Pharmacies typically print the medication instructions only on the boxes and not directly on the medication tubes.   If your medication is too expensive, please contact our office at [phone] option 4 or send [place] a message through [place].   We are unable to tell what your co-pay for medications will be in advance as this is different depending on your insurance coverage. However, we may be able to find a substitute medication at lower cost or fill out paperwork to get insurance to cover a needed medication.   If a prior authorization is required to get your medication covered by your insurance company, please allow [place] 1-2 business days to complete this process.  Drug prices often vary depending on where the prescription is filled and some pharmacies may offer cheaper prices.  The website www.goodrx.com contains coupons for medications through different pharmacies. The prices here do not account for what the cost may be with help from insurance (it may be cheaper with your insurance), but the website can give you the price if you did not use any insurance.  - You can print the associated coupon and take it with your prescription to the pharmacy.  - You may also stop by our office during regular business hours and pick up a GoodRx coupon card.  - If you need your prescription sent electronically to a different pharmacy, notify our office through [facility] or by phone at [phone] option 4.     Si Usted Necesita Algo Despus de Su Visita  Tambin puede enviarnos un mensaje a travs de Pharmacist, community. Por lo general respondemos a los mensajes de MyChart en el transcurso de 1 a 2  das hbiles.  Para renovar recetas, por favor pida a su farmacia que se ponga en contacto con nuestra oficina. Harland Dingwall de fax es Kindred [phone].  Si tiene un asunto urgente cuando la clnica est cerrada y que no puede esperar hasta el siguiente da hbil, puede llamar/localizar a su doctor(a) al nmero que aparece a continuacin.   Por favor, tenga en cuenta que aunque hacemos todo lo posible para estar disponibles para asuntos urgentes fuera del horario de [place], no estamos disponibles las 24 horas del da, los 7 das de la [place].   Si tiene un problema urgente y no puede comunicarse con nosotros, puede optar por buscar atencin mdica  en el consultorio de su doctor(a), en una clnica privada, en un centro de atencin urgente o en una sala de emergencias.  Si tiene [employer],  por favor llame inmediatamente al 911 o vaya a la sala de emergencias.  Nmeros de bper  - Dr. Nehemiah Massed: [phone]  - Dra. Moye: [phone]  - Dra. Nicole Kindred: [phone]  En caso de inclemencias del [place], por favor llame a Johnsie Kindred principal al [phone] para una actualizacin sobre el [place] de cualquier retraso o cierre.  Consejos para la medicacin en dermatologa: Por favor, guarde las cajas en las que vienen los medicamentos de uso tpico para ayudarle a seguir las instrucciones sobre dnde y cmo usarlos. Las farmacias generalmente imprimen las instrucciones del medicamento slo en las cajas y no directamente en los tubos del [place].   Si su medicamento es muy caro, por favor, pngase en contacto con Zigmund Daniel llamando al [phone] y presione la opcin 4 o envenos un mensaje a travs de Pharmacist, community.   No podemos decirle cul ser su copago por los medicamentos por adelantado ya que esto es diferente dependiendo de la cobertura de su seguro. Sin embargo, es posible que podamos encontrar un medicamento sustituto a Electrical engineer un formulario para que el  seguro cubra el medicamento que se considera necesario.   Si se requiere una autorizacin previa para que su compaa de seguros [place] su medicamento, por favor permtanos de 1 a 2 das hbiles para completar este proceso.  Los precios de los medicamentos varan con frecuencia dependiendo del Environmental consultant de dnde se surte la receta y alguna farmacias pueden ofrecer precios ms baratos.  El sitio web www.goodrx.com tiene cupones para medicamentos de Airline pilot. Los precios aqu no tienen en cuenta lo que podra costar con la ayuda del seguro (puede ser ms barato con su seguro), pero el sitio web puede darle el precio si no utiliz Research scientist (physical sciences).  - Puede imprimir el cupn correspondiente y llevarlo con su receta a la farmacia.  - Tambin puede pasar por nuestra oficina durante el horario de atencin regular y Charity fundraiser una tarjeta de cupones de GoodRx.  - Si necesita que su receta se enve electrnicamente a una farmacia diferente, informe a nuestra oficina a travs de MyChart de [place] o por telfono llamando al [phone] y presione la opcin 4.

## 2023-02-11 NOTE — Progress Notes (Unsigned)
Follow-Up Visit   Subjective  Ethan Perez is a 52 y.o. male who presents for the following: Annual Exam (History of Melanoma in situ (03/2019) - The patient presents for Total-Body Skin Exam (TBSE) for skin cancer screening and mole check.  The patient has spots, moles and lesions to be evaluated, some may be new or changing and the patient has concerns that these could be cancer./).  The following portions of the chart were reviewed this encounter and updated as appropriate:   Tobacco  Allergies  Meds  Problems  Med Hx  Surg Hx  Fam Hx     Review of Systems:  No other skin or systemic complaints except as noted in HPI or Assessment and Plan.  Objective  Well appearing patient in no apparent distress; mood and affect are within normal limits.  A full examination was performed including scalp, head, eyes, ears, nose, lips, neck, chest, axillae, abdomen, back, buttocks, bilateral upper extremities, bilateral lower extremities, hands, feet, fingers, toes, fingernails, and toenails. All findings within normal limits unless otherwise noted below.  Forehead Pinkness and scale  Left neck Flesh colored papule   Assessment & Plan   History of Melanoma in Situ - No lymphadenopathy - No evidence of recurrence today - Recommend regular full body skin exams - Recommend daily broad spectrum sunscreen SPF 30+ to sun-exposed areas, reapply every 2 hours as needed.  - Call if any new or changing lesions are noted between office visits  History of Dysplastic Nevi - No evidence of recurrence today - Recommend regular full body skin exams - Recommend daily broad spectrum sunscreen SPF 30+ to sun-exposed areas, reapply every 2 hours as needed.  - Call if any new or changing lesions are noted between office visits  Lentigines - Scattered tan macules - Due to sun exposure - Benign-appearing, observe - Recommend daily broad spectrum sunscreen SPF 30+ to sun-exposed areas, reapply every 2  hours as needed. - Call for any changes  Seborrheic Keratoses - Stuck-on, waxy, tan-brown papules and/or plaques  - Benign-appearing - Discussed benign etiology and prognosis. - Observe - Call for any changes  Melanocytic Nevi - Tan-brown and/or pink-flesh-colored symmetric macules and papules - Benign appearing on exam today - Observation - Call clinic for new or changing moles - Recommend daily use of broad spectrum spf 30+ sunscreen to sun-exposed areas.   Hemangiomas - Red papules - Discussed benign nature - Observe - Call for any changes  Actinic Damage - Chronic condition, secondary to cumulative UV/sun exposure - diffuse scaly erythematous macules with underlying dyspigmentation - Recommend daily broad spectrum sunscreen SPF 30+ to sun-exposed areas, reapply every 2 hours as needed.  - Staying in the shade or wearing long sleeves, sun glasses (UVA+UVB protection) and wide brim hats (4-inch brim around the entire circumference of the hat) are also recommended for sun protection.  - Call for new or changing lesions.  Skin cancer screening performed today.  Seborrheic dermatitis Forehead  hydrocortisone 2.5 % cream - Forehead Apply topically daily. Monday, Wednesday, Friday  Neoplasm of uncertain behavior of skin Left neck  Epidermal / dermal shaving  Lesion diameter (cm):  0.3 Informed consent: discussed and consent obtained   Timeout: patient name, date of birth, surgical site, and procedure verified   Procedure prep:  Patient was prepped and draped in usual sterile fashion Prep type:  Isopropyl alcohol Anesthesia: the lesion was anesthetized in a standard fashion   Anesthetic:  1% lidocaine w/ epinephrine 1-100,000  buffered w/ 8.4% NaHCO3 Instrument used: flexible razor blade   Hemostasis achieved with: pressure, aluminum chloride and electrodesiccation   Outcome: patient tolerated procedure well   Post-procedure details: sterile dressing applied and wound  care instructions given   Dressing type: bandage and petrolatum    Specimen 1 - Surgical pathology Differential Diagnosis: Irritated skin tag vs nevus R/O dysplasia Check Margins: No   Return in about 6 months (around 08/12/2023) for TBSE.  I, Ashok Cordia, CMA, am acting as scribe for Sarina Ser, MD . Documentation: I have reviewed the above documentation for accuracy and completeness, and I agree with the above.  Sarina Ser, MD

## 2023-02-12 ENCOUNTER — Encounter: Payer: Self-pay | Admitting: Dermatology

## 2023-02-18 ENCOUNTER — Encounter: Payer: 59 | Admitting: Family Medicine

## 2023-02-18 ENCOUNTER — Telehealth: Payer: Self-pay

## 2023-02-18 NOTE — Telephone Encounter (Signed)
Left message for patient to call office for results/hd 

## 2023-02-18 NOTE — Telephone Encounter (Signed)
-----   Message from Ralene Bathe, MD sent at 02/14/2023  6:10 PM EST ----- Diagnosis Skin , left neck [place]  Benign skin tag No further treatment needed

## 2023-03-07 ENCOUNTER — Ambulatory Visit: Payer: 59 | Admitting: Physician Assistant

## 2023-03-22 ENCOUNTER — Encounter: Payer: Self-pay | Admitting: Family Medicine

## 2023-03-22 ENCOUNTER — Ambulatory Visit (INDEPENDENT_AMBULATORY_CARE_PROVIDER_SITE_OTHER): Payer: 59 | Admitting: Family Medicine

## 2023-03-22 DIAGNOSIS — R252 Cramp and spasm: Secondary | ICD-10-CM

## 2023-03-22 DIAGNOSIS — E1169 Type 2 diabetes mellitus with other specified complication: Secondary | ICD-10-CM

## 2023-03-22 DIAGNOSIS — N401 Enlarged prostate with lower urinary tract symptoms: Secondary | ICD-10-CM | POA: Diagnosis not present

## 2023-03-22 DIAGNOSIS — H608X3 Other otitis externa, bilateral: Secondary | ICD-10-CM

## 2023-03-22 DIAGNOSIS — Z0000 Encounter for general adult medical examination without abnormal findings: Secondary | ICD-10-CM | POA: Diagnosis not present

## 2023-03-22 DIAGNOSIS — E7849 Other hyperlipidemia: Secondary | ICD-10-CM | POA: Diagnosis not present

## 2023-03-22 DIAGNOSIS — N3943 Post-void dribbling: Secondary | ICD-10-CM

## 2023-03-22 DIAGNOSIS — K219 Gastro-esophageal reflux disease without esophagitis: Secondary | ICD-10-CM

## 2023-03-22 DIAGNOSIS — Z1211 Encounter for screening for malignant neoplasm of colon: Secondary | ICD-10-CM

## 2023-03-22 LAB — BAYER DCA HB A1C WAIVED: HB A1C (BAYER DCA - WAIVED): 8.5 % — ABNORMAL HIGH (ref 4.8–5.6)

## 2023-03-22 LAB — URINALYSIS, ROUTINE W REFLEX MICROSCOPIC
Ketones, UA: NEGATIVE
Leukocytes,UA: NEGATIVE
Nitrite, UA: NEGATIVE
RBC, UA: NEGATIVE
Specific Gravity, UA: 1.025 (ref 1.005–1.030)
Urobilinogen, Ur: 1 mg/dL (ref 0.2–1.0)
pH, UA: 6 (ref 5.0–7.5)

## 2023-03-22 LAB — MICROSCOPIC EXAMINATION
Bacteria, UA: NONE SEEN
Epithelial Cells (non renal): NONE SEEN /hpf (ref 0–10)
WBC, UA: NONE SEEN /hpf (ref 0–5)

## 2023-03-22 LAB — MICROALBUMIN, URINE WAIVED
Creatinine, Urine Waived: 200 mg/dL (ref 10–300)
Microalb, Ur Waived: 80 mg/L — ABNORMAL HIGH (ref 0–19)

## 2023-03-22 MED ORDER — METFORMIN HCL 500 MG PO TABS: 500.0000 mg | ORAL_TABLET | Freq: Two times a day (BID) | ORAL | 1 refills | Status: DC

## 2023-03-22 MED ORDER — TAMSULOSIN HCL 0.4 MG PO CAPS: 0.8000 mg | ORAL_CAPSULE | Freq: Every day | ORAL | 1 refills | Status: DC

## 2023-03-22 MED ORDER — HYDROCORTISONE-ACETIC ACID 1-2 % OT SOLN: 3.0000 [drp] | Freq: Two times a day (BID) | OTIC | 0 refills | Status: DC

## 2023-03-22 NOTE — Assessment & Plan Note (Signed)
Stable. Continue to monitor. Call with any concerns.  ?

## 2023-03-22 NOTE — Assessment & Plan Note (Signed)
Not doing well with A1c up to 8.5 from 7.0. Will start him on metformin and recheck 3 months. Work on diet. Call with any concerns.

## 2023-03-22 NOTE — Progress Notes (Signed)
BP 120/84   Pulse 79   Temp 98.4 F (36.9 C) (Oral)   Ht 6' 4.25" (1.937 m)   Wt 298 lb 1.6 oz (135.2 kg)   SpO2 95%   BMI 36.05 kg/m    Subjective:    Patient ID: Ethan Perez, male    DOB: [birth year], 52 y.o.   MRN: [id number]  HPI: Ethan Perez is a 52 y.o. male presenting on 03/22/2023 for comprehensive medical examination. Current medical complaints include:  DIABETES Hypoglycemic episodes:no Polydipsia/polyuria: no Visual disturbance: no Chest pain: no Paresthesias: no Glucose Monitoring: no  Accucheck frequency: Not Checking Taking Insulin?: no Blood Pressure Monitoring: not checking Retinal Examination: Not up to Date Foot Exam: Up to Date Diabetic Education: Completed Pneumovax: Up to Date Influenza: Not up to Date Aspirin: no  HYPERLIPIDEMIA Hyperlipidemia status: stable Satisfied with current treatment?  yes Side effects:  no Medication compliance: N/A Past cholesterol meds: none Supplements: none Aspirin:  no The 10-year ASCVD risk score (Arnett DK, et al., 2019) is: 10.4%   Values used to calculate the score:     Age: 72 years     Sex: Male     Is Non-Hispanic African American: No     Diabetic: Yes     Tobacco smoker: No     Systolic Blood Pressure: 123456 mmHg     Is BP treated: No     HDL Cholesterol: 35 mg/dL     Total Cholesterol: 219 mg/dL Chest pain:  no Coronary artery disease:  no  BPH BPH status: controlled Satisfied with current treatment?: yes Medication side effects: no Medication compliance: excellent compliance Duration: chronic Nocturia: 1-2x per night Urinary frequency:no Incomplete voiding: no Urgency: yes Weak urinary stream: no Straining to start stream: no Dysuria: no Onset: gradual Severity: mild  Interim Problems from his last visit: no  Depression Screen done today and results listed below:     03/22/2023    1:10 PM 08/10/2022    8:41 AM 03/07/2022    3:37 PM 12/06/2021    4:05 PM 01/06/2020     9:07 AM  Depression screen PHQ 2/9  Decreased Interest 0 0 0 0 0  Down, Depressed, Hopeless 0 0 0 0 0  PHQ - 2 Score 0 0 0 0 0  Altered sleeping 0 0 2 0 0  Tired, decreased energy 0 0 2 1 3   Change in appetite 0 0 0 0 0  Feeling bad or failure about yourself  0 0 0 0 0  Trouble concentrating 0 0 0 0 0  Moving slowly or fidgety/restless 0 0 0 0 0  Suicidal thoughts 0 0 0 0 0  PHQ-9 Score 0 0 4 1 3   Difficult doing work/chores Not difficult at all Not difficult at all Not difficult at all Not difficult at all Not difficult at all    Past Medical History:  Past Medical History:  Diagnosis Date   Allergy    Asthma    Cancer ([place])    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 Right mid back paraspinal, sup. medial. Excised 04/14/2019   Migraines  Numbness and tingling of left thumb    Obesity    Ruptured disc, cervical 2015    Surgical History:  Past Surgical History:  Procedure Laterality Date   EYE SURGERY     KNEE SURGERY Left 2007   Arthoscopic   LAMINECTOMY  Feb 2016   MELANOMA EXCISION Right 04/14/2019   right mid back paraspinal sup med - [facility]   NASAL SEPTUM SURGERY  2001   sinus cancer removal  10/14/2018   TONSILLECTOMY  1976    Medications:  Current Outpatient Medications on File Prior to Visit  Medication Sig   albuterol (PROVENTIL HFA;VENTOLIN HFA) 108 (90 Base) MCG/ACT inhaler Inhale 1-2 puffs into the lungs every 6 (six) hours as needed for wheezing or shortness of breath.   aspirin-acetaminophen-caffeine (EXCEDRIN MIGRAINE) 250-250-65 MG tablet Take 2 tablets by mouth every 6 (six) hours  as needed for headache.   Blood Glucose Monitoring Suppl (ONE TOUCH ULTRA 2) w/Device KIT SMARTSIG:1 Each Via Meter As Directed   glucose blood (ONETOUCH ULTRA) test strip Use to check blood sugar twice a day.   hydrocortisone 2.5 % cream Apply topically daily. Monday, Wednesday, Friday   baclofen (LIORESAL) 10 MG tablet TAKE 1 TABLET(10 MG) BY MOUTH AT BEDTIME AS NEEDED FOR MUSCLE SPASMS (Patient not taking: Reported on 03/22/2023)   No current facility-administered medications on file prior to visit.    Allergies:  Allergies  Allergen Reactions   Cat Hair Extract Shortness Of Breath    Rabbits, [place] pigs   Other Swelling    [place] nuts   Prochlorperazine    Tramadol Other (See Comments)    Sleep for 24 hr.   Compazine [Prochlorperazine Edisylate] Other (See Comments)    Causes involuntary muscle movements.    Social History:  Social History   Socioeconomic History   Marital status: Married    Spouse name: Not on file   Number of children: 3   Years of education: Not on file   Highest education level: Not on file  Occupational History   Not on file  Tobacco Use   Smoking status: Never   Smokeless tobacco: Never  Vaping Use   Vaping Use: Never used  Substance and Sexual Activity   Alcohol use: No   Drug use: No   Sexual activity: Yes    Partners: Female    Birth control/protection: Condom  Other Topics Concern   Not on file  Social History Narrative   Not on file   Social Determinants of Health   Financial Resource Strain: Not on file  Food Insecurity: Not on file  Transportation Needs: Not on file  Physical Activity: Not on file  Stress: Not on file  Social Connections: Not on file  Intimate Partner Violence: Not on file   Social History   Tobacco Use  Smoking Status Never  Smokeless Tobacco Never   Social History   Substance and Sexual Activity  Alcohol Use No    Family History:  Family History  Problem Relation Age of Onset   Cirrhosis  Mother    Seizures Mother        s/p hit her head   Diabetes Mother    Hypertension Mother    Heart disease Mother    Kidney failure Mother    Migraines Father        with aura   Heart disease Paternal Grandfather    Lung disease Paternal Grandfather        worked in a  chicken house   Alcohol abuse Paternal Grandfather    Diabetes Brother    Migraines Brother        with aura   Diabetes Maternal Grandmother    Heart disease Maternal Grandmother    Diabetes Maternal Grandfather    Heart disease Maternal Grandfather    Stroke Maternal Grandfather    Heart disease Paternal Grandmother    Cancer Paternal Aunt     Past medical history, surgical history, medications, allergies, family history and social history reviewed with patient today and changes made to appropriate areas of the chart.   Review of Systems  Constitutional: Negative.   HENT: Negative.         Itchy ears  Eyes: Negative.   Respiratory: Negative.    Cardiovascular: Negative.   Gastrointestinal:  Positive for heartburn. Negative for abdominal pain, blood in stool, constipation, diarrhea, melena, nausea and vomiting.  Genitourinary: Negative.   Musculoskeletal:  Positive for joint pain and myalgias. Negative for back pain, falls and neck pain.  Skin: Negative.   Neurological: Negative.   Endo/Heme/Allergies: Negative.   Psychiatric/Behavioral: Negative.     All other ROS negative except what is listed above and in the HPI.      Objective:    BP 120/84   Pulse 79   Temp 98.4 F (36.9 C) (Oral)   Ht 6' 4.25" (1.937 m)   Wt 298 lb 1.6 oz (135.2 kg)   SpO2 95%   BMI 36.05 kg/m   Wt Readings from Last 3 Encounters:  03/22/23 298 lb 1.6 oz (135.2 kg)  08/10/22 284 lb (128.8 kg)  05/03/22 284 lb (128.8 kg)    Physical Exam Vitals and nursing note reviewed.  Constitutional:      General: He is not in acute distress.    Appearance: Normal appearance. He is obese. He is not ill-appearing,  toxic-appearing or diaphoretic.  HENT:     Head: Normocephalic and atraumatic.     Right Ear: Tympanic membrane, ear canal and external ear normal. There is no impacted cerumen.     Left Ear: Tympanic membrane, ear canal and external ear normal. There is no impacted cerumen.     Nose: Nose normal. No congestion or rhinorrhea.     Mouth/Throat:     Mouth: Mucous membranes are moist.     Pharynx: Oropharynx is clear. No oropharyngeal exudate or posterior oropharyngeal erythema.  Eyes:     General: No scleral icterus.       Right eye: No discharge.        Left eye: No discharge.     Extraocular Movements: Extraocular movements intact.     Conjunctiva/sclera: Conjunctivae normal.     Pupils: Pupils are equal, round, and reactive to light.  Neck:     Vascular: No carotid bruit.  Cardiovascular:     Rate and Rhythm: Normal rate and regular rhythm.     Pulses: Normal pulses.     Heart sounds: No murmur heard.    No friction rub. No gallop.  Pulmonary:     Effort: Pulmonary effort is normal. No respiratory distress.     Breath sounds: Normal breath sounds. No stridor. No wheezing, rhonchi or rales.  Chest:     Chest wall: No tenderness.  Abdominal:     General: Abdomen is flat. Bowel sounds are normal. There is no distension.     Palpations: Abdomen is soft. There is no mass.     Tenderness: There is no abdominal tenderness.  There is no right CVA tenderness, left CVA tenderness, guarding or rebound.     Hernia: No hernia is present.  Genitourinary:    Comments: Genital exam deferred with shared decision making Musculoskeletal:        General: No swelling, tenderness, deformity or signs of injury.     Cervical back: Normal range of motion and neck supple. No rigidity. No muscular tenderness.     Right lower leg: No edema.     Left lower leg: No edema.  Lymphadenopathy:     Cervical: No cervical adenopathy.  Skin:    General: Skin is warm and dry.     Capillary Refill: Capillary  refill takes less than 2 seconds.     Coloration: Skin is not jaundiced or pale.     Findings: No bruising, erythema, lesion or rash.  Neurological:     General: No focal deficit present.     Mental Status: He is alert and oriented to person, place, and time.     Cranial Nerves: No cranial nerve deficit.     Sensory: No sensory deficit.     Motor: No weakness.     Coordination: Coordination normal.     Gait: Gait normal.     Deep Tendon Reflexes: Reflexes normal.  Psychiatric:        Mood and Affect: Mood normal.        Behavior: Behavior normal.        Thought Content: Thought content normal.        Judgment: Judgment normal.     Results for orders placed or performed in visit on 08/10/22  Comprehensive metabolic panel  Result Value Ref Range   Glucose 180 (H) 70 - 99 mg/dL   BUN 11 6 - 24 mg/dL   Creatinine, Ser 0.99 0.76 - 1.27 mg/dL   eGFR 93 >59 mL/min/1.73   BUN/Creatinine Ratio 11 9 - 20   Sodium 137 134 - 144 mmol/L   Potassium 4.0 3.5 - 5.2 mmol/L   Chloride 100 96 - 106 mmol/L   CO2 21 20 - 29 mmol/L   Calcium 9.2 8.7 - 10.2 mg/dL   Total Protein 7.1 6.0 - 8.5 g/dL   Albumin 4.1 4.1 - 5.1 g/dL   Globulin, Total 3.0 1.5 - 4.5 g/dL   Albumin/Globulin Ratio 1.4 1.2 - 2.2   Bilirubin Total 0.4 0.0 - 1.2 mg/dL   Alkaline Phosphatase 81 44 - 121 IU/L   AST 22 0 - 40 IU/L   ALT 26 0 - 44 IU/L  CBC with Differential/Platelet  Result Value Ref Range   WBC 6.5 3.4 - 10.8 x10E3/uL   RBC 5.58 4.14 - 5.80 x10E6/uL   Hemoglobin 16.5 13.0 - 17.7 g/dL   Hematocrit 50.2 37.5 - 51.0 %   MCV 90 79 - 97 fL   MCH 29.6 26.6 - 33.0 pg   MCHC 32.9 31.5 - 35.7 g/dL   RDW 13.0 11.6 - 15.4 %   Platelets 190 150 - 450 x10E3/uL   Neutrophils 53 Not Estab. %   Lymphs 34 Not Estab. %   Monocytes 8 Not Estab. %   Eos 4 Not Estab. %   Basos 1 Not Estab. %   Neutrophils Absolute 3.5 1.4 - 7.0 x10E3/uL   Lymphocytes Absolute 2.2 0.7 - 3.1 x10E3/uL   Monocytes Absolute 0.5 0.1 - 0.9  x10E3/uL   EOS (ABSOLUTE) 0.2 0.0 - 0.4 x10E3/uL   Basophils Absolute 0.1 0.0 - 0.2 x10E3/uL  Immature Granulocytes 0 Not Estab. %   Immature Grans (Abs) 0.0 0.0 - 0.1 x10E3/uL  Bayer DCA Hb A1c Waived  Result Value Ref Range   HB A1C (BAYER DCA - WAIVED) 7.0 (H) 4.8 - 5.6 %  Lipid Panel w/o Chol/HDL Ratio  Result Value Ref Range   Cholesterol, Total 219 (H) 100 - 199 mg/dL   Triglycerides 171 (H) 0 - 149 mg/dL   HDL 35 (L) >39 mg/dL   VLDL Cholesterol Cal 31 5 - 40 mg/dL   LDL Chol Calc (NIH) 153 (H) 0 - 99 mg/dL  Hepatitis C Antibody  Result Value Ref Range   Hep C Virus Ab Non Reactive Non Reactive  VITAMIN D 25 Hydroxy (Vit-D Deficiency, Fractures)  Result Value Ref Range   Vit D, 25-Hydroxy 26.1 (L) 30.0 - 100.0 ng/mL  Phosphorus  Result Value Ref Range   Phosphorus 3.4 2.8 - 4.1 mg/dL  Magnesium  Result Value Ref Range   Magnesium 1.7 1.6 - 2.3 mg/dL      Assessment & Plan:   Problem List Items Addressed This Visit       Digestive   Gastroesophageal reflux disease    Stable. Continue to monitor. Call with any concerns.       Relevant Orders   Comprehensive metabolic panel   CBC with Differential/Platelet     Endocrine   Type 2 diabetes mellitus with other specified complication ([place])    Not doing well with A1c up to 8.5 from 7.0. Will start him on metformin and recheck 3 months. Work on diet. Call with any concerns.      Relevant Medications   metFORMIN (GLUCOPHAGE) 500 MG tablet   Other Relevant Orders   Bayer DCA Hb A1c Waived   Comprehensive metabolic panel   Urinalysis, Routine w reflex microscopic   Microalbumin, Urine Waived     Genitourinary   Benign prostatic hyperplasia with post-void dribbling    Under good control on current regimen. Continue current regimen. Continue to monitor. Call with any concerns. Refills given. Labs drawn today.       Relevant Medications   tamsulosin (FLOMAX) 0.4 MG CAPS capsule   Other Relevant Orders    Comprehensive metabolic panel   PSA     Other   Hyperlipidemia (Chronic)    Rechecking labs today. Await results. Treat as needed.       Relevant Orders   Comprehensive metabolic panel   Lipid Panel w/o Chol/HDL Ratio   Other Visit Diagnoses     Routine general medical examination at a health care facility       Vaccines up to date. Screening labs checked today. Cologuard ordered. Continue diet and exercise. Call wtih any concerns.   Relevant Orders   Bayer DCA Hb A1c Waived   Comprehensive metabolic panel   CBC with Differential/Platelet   Lipid Panel w/o Chol/HDL Ratio   PSA   TSH   Urinalysis, Routine w reflex microscopic   Microalbumin, Urine Waived   Screening for colon cancer       Cologuard ordered today.   Relevant Orders   Cologuard   Cramps of left lower extremity       Will check labs. Await results. Treat as needed.   Relevant Orders   Magnesium   Phosphorus   VITAMIN D 25 Hydroxy (Vit-D Deficiency, Fractures)   Chronic eczematous otitis externa of both ears       Ear drops sent through to his pharmacy.  LABORATORY TESTING:  Health maintenance labs ordered today as discussed above.   The natural history of prostate cancer and ongoing controversy regarding screening and potential treatment outcomes of prostate cancer has been discussed with the patient. The meaning of a false positive PSA and a false negative PSA has been discussed. He indicates understanding of the limitations of this screening test and wishes to proceed with screening PSA testing.   IMMUNIZATIONS:   - Tdap: Tetanus vaccination status reviewed: last tetanus booster within 10 years. - Influenza: Postponed to flu season - Pneumovax: Up to date - Prevnar: Not applicable - COVID: Refused - HPV: Not applicable - Shingrix vaccine: Refused  SCREENING: - Colonoscopy: Ordered today  Discussed with patient purpose of the colonoscopy is to detect colon cancer at curable precancerous or  early stages   PATIENT COUNSELING:    Sexuality: Discussed sexually transmitted diseases, partner selection, use of condoms, avoidance of unintended pregnancy  and contraceptive alternatives.   Advised to avoid cigarette smoking.  I discussed with the patient that most people either abstain from alcohol or drink within safe limits (<=14/week and <=4 drinks/occasion for males, <=7/weeks and <= 3 drinks/occasion for females) and that the risk for alcohol disorders and other health effects rises proportionally with the number of drinks per week and how often a drinker exceeds daily limits.  Discussed cessation/primary prevention of drug use and availability of treatment for abuse.   Diet: Encouraged to adjust caloric intake to maintain  or achieve ideal body weight, to reduce intake of dietary saturated fat and total fat, to limit sodium intake by avoiding high sodium foods and not adding table salt, and to maintain adequate dietary potassium and calcium preferably from fresh fruits, vegetables, and low-fat dairy products.    stressed the importance of regular exercise  Injury prevention: Discussed safety belts, safety helmets, smoke detector, smoking near bedding or upholstery.   Dental health: Discussed importance of regular tooth brushing, flossing, and dental visits.   Follow up plan: NEXT PREVENTATIVE PHYSICAL DUE IN 1 YEAR. Return in about 3 months (around 06/22/2023).

## 2023-03-22 NOTE — Assessment & Plan Note (Signed)
Under good control on current regimen. Continue current regimen. Continue to monitor. Call with any concerns. Refills given. Labs drawn today.   

## 2023-03-22 NOTE — Assessment & Plan Note (Signed)
Rechecking labs today. Await results. Treat as needed.  °

## 2023-03-23 LAB — COMPREHENSIVE METABOLIC PANEL
ALT: 39 IU/L (ref 0–44)
AST: 34 IU/L (ref 0–40)
Albumin/Globulin Ratio: 1.3 (ref 1.2–2.2)
Albumin: 4.1 g/dL (ref 3.8–4.9)
Alkaline Phosphatase: 104 IU/L (ref 44–121)
BUN/Creatinine Ratio: 12 (ref 9–20)
BUN: 11 mg/dL (ref 6–24)
Bilirubin Total: 0.9 mg/dL (ref 0.0–1.2)
CO2: 24 mmol/L (ref 20–29)
Calcium: 9.4 mg/dL (ref 8.7–10.2)
Chloride: 100 mmol/L (ref 96–106)
Creatinine, Ser: 0.94 mg/dL (ref 0.76–1.27)
Globulin, Total: 3.2 g/dL (ref 1.5–4.5)
Glucose: 140 mg/dL — ABNORMAL HIGH (ref 70–99)
Potassium: 4.4 mmol/L (ref 3.5–5.2)
Sodium: 139 mmol/L (ref 134–144)
Total Protein: 7.3 g/dL (ref 6.0–8.5)
eGFR: 98 mL/min/{1.73_m2} (ref >59)

## 2023-03-23 LAB — CBC WITH DIFFERENTIAL/PLATELET
Basophils Absolute: 0.1 10*3/uL (ref 0.0–0.2)
Basos: 1 %
EOS (ABSOLUTE): 0.3 10*3/uL (ref 0.0–0.4)
Eos: 4 %
Hematocrit: 51.1 % — ABNORMAL HIGH (ref 37.5–51.0)
Hemoglobin: 17.4 g/dL (ref 13.0–17.7)
Immature Grans (Abs): 0 10*3/uL (ref 0.0–0.1)
Immature Granulocytes: 0 %
Lymphocytes Absolute: 2.4 10*3/uL (ref 0.7–3.1)
Lymphs: 35 %
MCH: 29.9 pg (ref 26.6–33.0)
MCHC: 34.1 g/dL (ref 31.5–35.7)
MCV: 88 fL (ref 79–97)
Monocytes Absolute: 0.6 10*3/uL (ref 0.1–0.9)
Monocytes: 9 %
Neutrophils Absolute: 3.4 10*3/uL (ref 1.4–7.0)
Neutrophils: 51 %
Platelets: 223 10*3/uL (ref 150–450)
RBC: 5.82 x10E6/uL — ABNORMAL HIGH (ref 4.14–5.80)
RDW: 12.7 % (ref 11.6–15.4)
WBC: 6.7 10*3/uL (ref 3.4–10.8)

## 2023-03-23 LAB — PSA: Prostate Specific Ag, Serum: 0.5 ng/mL (ref 0.0–4.0)

## 2023-03-23 LAB — PHOSPHORUS: Phosphorus: 3.6 mg/dL (ref 2.8–4.1)

## 2023-03-23 LAB — LIPID PANEL W/O CHOL/HDL RATIO
Cholesterol, Total: 220 mg/dL — ABNORMAL HIGH (ref 100–199)
HDL: 36 mg/dL — ABNORMAL LOW (ref >39)
LDL Chol Calc (NIH): 148 mg/dL — ABNORMAL HIGH (ref 0–99)
Triglycerides: 196 mg/dL — ABNORMAL HIGH (ref 0–149)
VLDL Cholesterol Cal: 36 mg/dL (ref 5–40)

## 2023-03-23 LAB — MAGNESIUM: Magnesium: 1.8 mg/dL (ref 1.6–2.3)

## 2023-03-23 LAB — TSH: TSH: 1.56 u[IU]/mL (ref 0.450–4.500)

## 2023-03-23 LAB — VITAMIN D 25 HYDROXY (VIT D DEFICIENCY, FRACTURES): Vit D, 25-Hydroxy: 26.6 ng/mL — ABNORMAL LOW (ref 30.0–100.0)

## 2023-03-25 ENCOUNTER — Other Ambulatory Visit: Payer: Self-pay | Admitting: Family Medicine

## 2023-03-25 MED ORDER — VITAMIN D (ERGOCALCIFEROL) 1.25 MG (50000 UNIT) PO CAPS
50000.00 [IU] | ORAL_CAPSULE | ORAL | 0 refills | Status: DC
Start: 2023-03-25 — End: 2023-07-01

## 2023-03-26 ENCOUNTER — Telehealth: Payer: Self-pay

## 2023-03-26 NOTE — Telephone Encounter (Signed)
-----   Message from David C Kowalski, MD sent at 02/14/2023  6:10 PM EST ----- Diagnosis Skin , left neck [place]  Benign skin tag No further treatment needed 

## 2023-03-26 NOTE — Telephone Encounter (Signed)
Patient informed of pathology results 

## 2023-06-18 ENCOUNTER — Ambulatory Visit: Payer: 59 | Admitting: Family Medicine

## 2023-06-18 ENCOUNTER — Encounter: Payer: Self-pay | Admitting: Family Medicine

## 2023-06-18 VITALS — BP 130/86 | HR 96 | Temp 98.2°F | Wt 286.0 lb

## 2023-06-18 DIAGNOSIS — E1169 Type 2 diabetes mellitus with other specified complication: Secondary | ICD-10-CM | POA: Diagnosis not present

## 2023-06-18 NOTE — Progress Notes (Unsigned)
BP 130/86   Pulse 96   Temp 98.2 F (36.8 C) (Oral)   Wt 286 lb (129.7 kg)   SpO2 94%   BMI 34.59 kg/m    Subjective:    Patient ID: Ethan Perez, male    DOB: [birth year], 52 y.o.   MRN: [id number]  HPI: Ethan Perez is a 52 y.o. male  Chief Complaint  Patient presents with   Diabetes   DIABETES Hypoglycemic episodes:{Blank single:19197::"yes","no"} Polydipsia/polyuria: {Blank single:19197::"yes","no"} Visual disturbance: {Blank single:19197::"yes","no"} Chest pain: {Blank single:19197::"yes","no"} Paresthesias: {Blank single:19197::"yes","no"} Glucose Monitoring: {Blank single:19197::"yes","no"}  Accucheck frequency: {Blank single:19197::"Not Checking","Daily","BID","TID"}  Fasting glucose:  Post prandial:  Evening:  Before meals: Taking Insulin?: {Blank single:19197::"yes","no"}  Long acting insulin:  Short acting insulin: Blood Pressure Monitoring: {Blank single:19197::"not checking","rarely","daily","weekly","monthly","a few times a day","a few times a week","a few times a month"} Retinal Examination: {Blank single:19197::"Up to Date","Not up to Date"} Foot Exam: {Blank single:19197::"Up to Date","Not up to Date"} Diabetic Education: {Blank single:19197::"Completed","Not Completed"} Pneumovax: {Blank single:19197::"Up to Date","Not up to Date","unknown"} Influenza: {Blank single:19197::"Up to Date","Not up to Date","unknown"} Aspirin: {Blank single:19197::"yes","no"}  Relevant past medical, surgical, family and social history reviewed and updated as indicated. Interim medical history since our last visit reviewed. Allergies and medications reviewed and updated.  Review of Systems  Per HPI unless specifically indicated above     Objective:    BP 130/86   Pulse 96   Temp 98.2 F (36.8 C) (Oral)   Wt 286 lb (129.7 kg)   SpO2 94%   BMI 34.59 kg/m   Wt Readings from Last 3 Encounters:  06/18/23 286 lb (129.7 kg)  03/22/23 298 lb 1.6 oz  (135.2 kg)  08/10/22 284 lb (128.8 kg)    Physical Exam  Results for orders placed or performed in visit on 03/22/23  Microscopic Examination   BLD  Result Value Ref Range   WBC, UA None seen 0 - 5 /hpf   RBC, Urine 0-2 0 - 2 /hpf   Epithelial Cells (non renal) None seen 0 - 10 /hpf   Bacteria, UA None seen None seen/Few  Bayer DCA Hb A1c Waived  Result Value Ref Range   HB A1C (BAYER DCA - WAIVED) 8.5 (H) 4.8 - 5.6 %  Comprehensive metabolic panel  Result Value Ref Range   Glucose 140 (H) 70 - 99 mg/dL   BUN 11 6 - 24 mg/dL   Creatinine, Ser 4.40 0.76 - 1.27 mg/dL   eGFR 98 >10 UV/OZD/6.64   BUN/Creatinine Ratio 12 9 - 20   Sodium 139 134 - 144 mmol/L   Potassium 4.4 3.5 - 5.2 mmol/L   Chloride 100 96 - 106 mmol/L   CO2 24 20 - 29 mmol/L   Calcium 9.4 8.7 - 10.2 mg/dL   Total Protein 7.3 6.0 - 8.5 g/dL   Albumin 4.1 3.8 - 4.9 g/dL   Globulin, Total 3.2 1.5 - 4.5 g/dL   Albumin/Globulin Ratio 1.3 1.2 - 2.2   Bilirubin Total 0.9 0.0 - 1.2 mg/dL   Alkaline Phosphatase 104 44 - 121 IU/L   AST 34 0 - 40 IU/L   ALT 39 0 - 44 IU/L  CBC with Differential/Platelet  Result Value Ref Range   WBC 6.7 3.4 - 10.8 x10E3/uL   RBC 5.82 (H) 4.14 - 5.80 x10E6/uL   Hemoglobin 17.4 13.0 - 17.7 g/dL   Hematocrit 40.3 (H) 47.4 - 51.0 %   MCV 88 79 - 97 fL   MCH 29.9 26.6 - 33.0  pg   MCHC 34.1 31.5 - 35.7 g/dL   RDW 09.8 11.9 - 14.7 %   Platelets 223 150 - 450 x10E3/uL   Neutrophils 51 Not Estab. %   Lymphs 35 Not Estab. %   Monocytes 9 Not Estab. %   Eos 4 Not Estab. %   Basos 1 Not Estab. %   Neutrophils Absolute 3.4 1.4 - 7.0 x10E3/uL   Lymphocytes Absolute 2.4 0.7 - 3.1 x10E3/uL   Monocytes Absolute 0.6 0.1 - 0.9 x10E3/uL   EOS (ABSOLUTE) 0.3 0.0 - 0.4 x10E3/uL   Basophils Absolute 0.1 0.0 - 0.2 x10E3/uL   Immature Granulocytes 0 Not Estab. %   Immature Grans (Abs) 0.0 0.0 - 0.1 x10E3/uL  Lipid Panel w/o Chol/HDL Ratio  Result Value Ref Range   Cholesterol, Total 220 (H) 100  - 199 mg/dL   Triglycerides 829 (H) 0 - 149 mg/dL   HDL 36 (L) >56 mg/dL   VLDL Cholesterol Cal 36 5 - 40 mg/dL   LDL Chol Calc (NIH) 213 (H) 0 - 99 mg/dL  PSA  Result Value Ref Range   Prostate Specific Ag, Serum 0.5 0.0 - 4.0 ng/mL  TSH  Result Value Ref Range   TSH 1.560 0.450 - 4.500 uIU/mL  Urinalysis, Routine w reflex microscopic  Result Value Ref Range   Specific Gravity, UA 1.025 1.005 - 1.030   pH, UA 6.0 5.0 - 7.5   Color, UA Orange Yellow   Appearance Ur Clear Clear   Leukocytes,UA Negative Negative   Protein,UA 2+ (A) Negative/Trace   Glucose, UA Trace (A) Negative   Ketones, UA Negative Negative   RBC, UA Negative Negative   Bilirubin, UA CANCELED    Urobilinogen, Ur 1.0 0.2 - 1.0 mg/dL   Nitrite, UA Negative Negative   Microscopic Examination See below:   Microalbumin, Urine Waived  Result Value Ref Range   Microalb, Ur Waived 80 (H) 0 - 19 mg/L   Creatinine, Urine Waived 200 10 - 300 mg/dL   Microalb/Creat Ratio 30-300 (H) <30 mg/g  Magnesium  Result Value Ref Range   Magnesium 1.8 1.6 - 2.3 mg/dL  Phosphorus  Result Value Ref Range   Phosphorus 3.6 2.8 - 4.1 mg/dL  VITAMIN D 25 Hydroxy (Vit-D Deficiency, Fractures)  Result Value Ref Range   Vit D, 25-Hydroxy 26.6 (L) 30.0 - 100.0 ng/mL      Assessment & Plan:   Problem List Items Addressed This Visit       Endocrine   Type 2 diabetes mellitus with other specified complication ([facility]) - Primary   Relevant Orders   Bayer DCA Hb A1c Waived   Hgb A1c w/o eAG   CBC with Differential/Platelet     Follow up plan: No follow-ups on file.

## 2023-06-19 LAB — CBC WITH DIFFERENTIAL/PLATELET
Basophils Absolute: 0.1 10*3/uL (ref 0.0–0.2)
Basos: 1 %
EOS (ABSOLUTE): 0.4 10*3/uL (ref 0.0–0.4)
Eos: 4 %
Hematocrit: 49.9 % (ref 37.5–51.0)
Hemoglobin: 16.8 g/dL (ref 13.0–17.7)
Immature Grans (Abs): 0 10*3/uL (ref 0.0–0.1)
Immature Granulocytes: 0 %
Lymphocytes Absolute: 3 10*3/uL (ref 0.7–3.1)
Lymphs: 32 %
MCH: 29.3 pg (ref 26.6–33.0)
MCHC: 33.7 g/dL (ref 31.5–35.7)
MCV: 87 fL (ref 79–97)
Monocytes Absolute: 0.8 10*3/uL (ref 0.1–0.9)
Monocytes: 8 %
Neutrophils Absolute: 5.1 10*3/uL (ref 1.4–7.0)
Neutrophils: 55 %
Platelets: 216 10*3/uL (ref 150–450)
RBC: 5.74 x10E6/uL (ref 4.14–5.80)
RDW: 12.7 % (ref 11.6–15.4)
WBC: 9.3 10*3/uL (ref 3.4–10.8)

## 2023-06-19 LAB — HGB A1C W/O EAG: Hgb A1c MFr Bld: 8.5 % — ABNORMAL HIGH (ref 4.8–5.6)

## 2023-06-20 ENCOUNTER — Encounter: Payer: Self-pay | Admitting: Family Medicine

## 2023-06-20 NOTE — Assessment & Plan Note (Signed)
Not under good control with A1c of 8.5. Stopped metformin due to side effects. Will change him to rybelsus and recheck labs in about 3 months.

## 2023-06-28 ENCOUNTER — Other Ambulatory Visit: Payer: Self-pay | Admitting: Family Medicine

## 2023-06-28 NOTE — Telephone Encounter (Signed)
Requested medication (s) are due for refill today:yes  Requested medication (s) are on the active medication list:yes  Last refill:  03/25/23 #12  Future visit scheduled: no  Notes to clinic:  med not delegated to NT to RF   Requested Prescriptions  Pending Prescriptions Disp Refills   Vitamin D, Ergocalciferol, (DRISDOL) 1.25 MG (50000 UNIT) CAPS capsule [Pharmacy Med Name: VITAMIN D2 50,000IU (ERGO) CAP RX] 12 capsule 0    Sig: TAKE 1 CAPSULE BY MOUTH EVERY 7 DAYS     Endocrinology:  Vitamins - Vitamin D Supplementation 2 Failed - 06/28/2023 12:17 PM      Failed - Manual Review: Route requests for 50,000 IU strength to the provider      Failed - Vitamin D in normal range and within 360 days    Vit D, 25-Hydroxy  Date Value Ref Range Status  03/22/2023 26.6 (L) 30.0 - 100.0 ng/mL Final    Comment:    Vitamin D deficiency has been defined by the Institute of Medicine and an Endocrine Society practice guideline as a level of serum 25-OH vitamin D less than 20 ng/mL (1,2). The Endocrine Society went on to further define vitamin D insufficiency as a level between 21 and 29 ng/mL (2). 1. IOM (Institute of Medicine). 2010. Dietary reference    intakes for calcium and D. [place]: The    [employer]. 2. Holick MF, Binkley Odin, Bischoff-Ferrari HA, et al.    Evaluation, treatment, and prevention of vitamin D    deficiency: an Endocrine Society clinical practice    guideline. JCEM. 2011 Jul; 96(7):1911-30.          Passed - Ca in normal range and within 360 days    Calcium  Date Value Ref Range Status  03/22/2023 9.4 8.7 - 10.2 mg/dL Final   Calcium, Total  Date Value Ref Range Status  07/09/2013 9.0 8.5 - 10.1 mg/dL Final         Passed - Valid encounter within last 12 months    Recent Outpatient Visits           1 week ago Type 2 diabetes mellitus with other specified complication, without long-term current use of insulin (HCC)   [place] [facility] [place], Megan P, DO   3 months ago Routine general medical examination at a health care facility   [facility], [place] P, DO   10 months ago Type 2 diabetes mellitus with other specified complication, without long-term current use of insulin [facility])   [place] [facility] [place], [place], DO   1 year ago Torticollis   [facility] [place], [place] P, DO   1 year ago Acute midline thoracic back pain   [facility] Dorcas Carrow, DO       Future Appointments             In 2 months Laural Benes, Oralia Rud, DO [facility]   In 2 months Deirdre Evener, MD [facility]

## 2023-09-10 ENCOUNTER — Ambulatory Visit: Payer: 59 | Admitting: Family Medicine

## 2023-09-12 ENCOUNTER — Encounter: Payer: Self-pay | Admitting: Dermatology

## 2023-09-12 ENCOUNTER — Ambulatory Visit: Payer: 59 | Admitting: Dermatology

## 2023-09-12 DIAGNOSIS — L578 Other skin changes due to chronic exposure to nonionizing radiation: Secondary | ICD-10-CM | POA: Diagnosis not present

## 2023-09-12 DIAGNOSIS — L821 Other seborrheic keratosis: Secondary | ICD-10-CM

## 2023-09-12 DIAGNOSIS — L814 Other melanin hyperpigmentation: Secondary | ICD-10-CM | POA: Diagnosis not present

## 2023-09-12 DIAGNOSIS — Z1283 Encounter for screening for malignant neoplasm of skin: Secondary | ICD-10-CM | POA: Diagnosis not present

## 2023-09-12 DIAGNOSIS — Z86018 Personal history of other benign neoplasm: Secondary | ICD-10-CM

## 2023-09-12 DIAGNOSIS — W908XXA Exposure to other nonionizing radiation, initial encounter: Secondary | ICD-10-CM

## 2023-09-12 DIAGNOSIS — Z85820 Personal history of malignant melanoma of skin: Secondary | ICD-10-CM

## 2023-09-12 DIAGNOSIS — D1801 Hemangioma of skin and subcutaneous tissue: Secondary | ICD-10-CM

## 2023-09-12 DIAGNOSIS — D229 Melanocytic nevi, unspecified: Secondary | ICD-10-CM

## 2023-09-12 DIAGNOSIS — Z86006 Personal history of melanoma in-situ: Secondary | ICD-10-CM

## 2023-09-12 NOTE — Patient Instructions (Signed)
Recommend daily broad spectrum sunscreen SPF 30+ to sun-exposed areas, reapply every 2 hours as needed. Call for new or changing lesions.  Staying in the shade or wearing long sleeves, sun glasses (UVA+UVB protection) and wide brim hats (4-inch brim around the entire circumference of the hat) are also recommended for sun protection.    Melanoma ABCDEs  Melanoma is the most dangerous type of skin cancer, and is the leading cause of death from skin disease.  You are more likely to develop melanoma if you: Have light-colored skin, light-colored eyes, or red or blond hair Spend a lot of time in the sun Tan regularly, either outdoors or in a tanning bed Have had blistering sunburns, especially during childhood Have a close family member who has had a melanoma Have atypical moles or large birthmarks  Early detection of melanoma is key since treatment is typically straightforward and cure rates are extremely high if we catch it early.   The first sign of melanoma is often a change in a mole or a new dark spot.  The ABCDE system is a way of remembering the signs of melanoma.  A for asymmetry:  The two halves do not match. B for border:  The edges of the growth are irregular. C for color:  A mixture of colors are present instead of an even brown color. D for diameter:  Melanomas are usually (but not always) greater than 6mm - the size of a pencil eraser. E for evolution:  The spot keeps changing in size, shape, and color.  Please check your skin once per month between visits. You can use a small mirror in front and a large mirror behind you to keep an eye on the back side or your body.   If you see any new or changing lesions before your next follow-up, please call to schedule a visit.  Please continue daily skin protection including broad spectrum sunscreen SPF 30+ to sun-exposed areas, reapplying every 2 hours as needed when you're outdoors.   Staying in the shade or wearing long sleeves, sun  glasses (UVA+UVB protection) and wide brim hats (4-inch brim around the entire circumference of the hat) are also recommended for sun protection.     Due to recent changes in healthcare laws, you may see results of your pathology and/or laboratory studies on MyChart before the doctors have had a chance to review them. We understand that in some cases there may be results that are confusing or concerning to you. Please understand that not all results are received at the same time and often the doctors may need to interpret multiple results in order to provide you with the best plan of care or course of treatment. Therefore, we ask that you please give [place] 2 business days to thoroughly review all your results before contacting the office for clarification. Should we see a critical lab result, you will be contacted sooner.   If You Need Anything After Your Visit  If you have any questions or concerns for your doctor, please call our main line at [phone] and press option 4 to reach your doctor's medical assistant. If no one answers, please leave a voicemail as directed and we will return your call as soon as possible. Messages left after 4 pm will be answered the following business day.   You may also send [place] a message via MyChart. We typically respond to MyChart messages within 1-2 business days.  For prescription refills, please ask your pharmacy to contact  our office. Our fax number is [phone].  If you have an urgent issue when the clinic is closed that cannot wait until the next business day, you can page your doctor at the number below.    Please note that while we do our best to be available for urgent issues outside of office hours, we are not available 24/7.   If you have an urgent issue and are unable to reach [place], you may choose to seek medical care at your doctor's office, retail clinic, urgent care center, or emergency room.  If you have a medical emergency, please immediately  call 911 or go to the emergency department.  Pager Numbers  - Dr. Gwen Pounds: [phone]  - Dr. Roseanne Reno: [phone]  - Dr. Katrinka Blazing: [phone]   In the event of inclement weather, please call our main line at [phone] for an update on the status of any delays or closures.  Dermatology Medication Tips: Please keep the boxes that topical medications come in in order to help keep track of the instructions about where and how to use these. Pharmacies typically print the medication instructions only on the boxes and not directly on the medication tubes.   If your medication is too expensive, please contact our office at [phone] option 4 or send [place] a message through MyChart.   We are unable to tell what your co-pay for medications will be in advance as this is different depending on your insurance coverage. However, we may be able to find a substitute medication at lower cost or fill out paperwork to get insurance to cover a needed medication.   If a prior authorization is required to get your medication covered by your insurance company, please allow [place] 1-2 business days to complete this process.  Drug prices often vary depending on where the prescription is filled and some pharmacies may offer cheaper prices.  The website www.goodrx.com contains coupons for medications through different pharmacies. The prices here do not account for what the cost may be with help from insurance (it may be cheaper with your insurance), but the website can give you the price if you did not use any insurance.  - You can print the associated coupon and take it with your prescription to the pharmacy.  - You may also stop by our office during regular business hours and pick up a GoodRx coupon card.  - If you need your prescription sent electronically to a different pharmacy, notify our office through [facility] or by phone at [phone] option 4.     Si Usted Necesita Algo Despus de Su  Visita  Tambin puede enviarnos un mensaje a travs de Clinical cytogeneticist. Por lo general respondemos a los mensajes de MyChart en el transcurso de 1 a 2 das hbiles.  Para renovar recetas, por favor pida a su farmacia que se ponga en contacto con nuestra oficina. Annie Sable de fax es [place] [phone].  Si tiene un asunto urgente cuando la clnica est cerrada y que no puede esperar hasta el siguiente da hbil, puede llamar/localizar a su doctor(a) al nmero que aparece a continuacin.   Por favor, tenga en cuenta que aunque hacemos todo lo posible para estar disponibles para asuntos urgentes fuera del horario de [place], no estamos disponibles las 24 horas del da, los 7 [address]  [address] de la [place].   Si tiene un problema urgente y no puede comunicarse con nosotros, puede optar por buscar atencin mdica  en el consultorio de su doctor(a), en una clnica  privada, en un centro de atencin urgente o en una sala de emergencias.  Si tiene Engineer, drilling, por favor llame inmediatamente al 911 o vaya a la sala de emergencias.  Nmeros de bper  - Dr. Gwen Pounds: [phone]  - Dra. Roseanne Reno: [phone]  - Dr. Katrinka Blazing: [phone]   En caso de inclemencias del tiempo, por favor llame a Lacy Duverney principal al [phone] para una actualizacin sobre el [place] de cualquier retraso o cierre.  Consejos para la medicacin en dermatologa: Por favor, guarde las cajas en las que vienen los medicamentos de uso tpico para ayudarle a seguir las instrucciones sobre dnde y cmo usarlos. Las farmacias generalmente imprimen las instrucciones del medicamento slo en las cajas y no directamente en los tubos del [place].   Si su medicamento es muy caro, por favor, pngase en contacto con Rolm Gala llamando al [phone] y presione la opcin 4 o envenos un mensaje a travs de Clinical cytogeneticist.   No podemos decirle cul ser su copago por los medicamentos por adelantado ya que esto es diferente dependiendo de  la cobertura de su seguro. Sin embargo, es posible que podamos encontrar un medicamento sustituto a Audiological scientist un formulario para que el seguro cubra el medicamento que se considera necesario.   Si se requiere una autorizacin previa para que su compaa de seguros [place] su medicamento, por favor permtanos de 1 a 2 das hbiles para completar [address]  Los precios de los medicamentos varan con frecuencia dependiendo del Environmental consultant de dnde se surte la receta y alguna farmacias pueden ofrecer precios ms baratos.  El sitio web www.goodrx.com tiene cupones para medicamentos de Health and safety inspector. Los precios aqu no tienen en cuenta lo que podra costar con la ayuda del seguro (puede ser ms barato con su seguro), pero el sitio web puede darle el precio si no utiliz Tourist information centre manager.  - Puede imprimir el cupn correspondiente y llevarlo con su receta a la farmacia.  - Tambin puede pasar por nuestra oficina durante el horario de atencin regular y Education officer, museum una tarjeta de cupones de GoodRx.  - Si necesita que su receta se enve electrnicamente a una farmacia diferente, informe a nuestra oficina a travs de MyChart de [place] o por telfono llamando al [phone] y presione la opcin 4.

## 2023-09-12 NOTE — Progress Notes (Signed)
   Follow-Up Visit   Subjective  Ethan Perez is a 52 y.o. male who presents for the following: Skin Cancer Screening and Full Body Skin Exam. HxMIS, HxDN  The patient presents for Total-Body Skin Exam (TBSE) for skin cancer screening and mole check. The patient has spots, moles and lesions to be evaluated, some may be new or changing and the patient may have concern these could be cancer.    The following portions of the chart were reviewed this encounter and updated as appropriate: medications, allergies, medical history  Review of Systems:  No other skin or systemic complaints except as noted in HPI or Assessment and Plan.  Objective  Well appearing patient in no apparent distress; mood and affect are within normal limits.  A full examination was performed including scalp, head, eyes, ears, nose, lips, neck, chest, axillae, abdomen, back, buttocks, bilateral upper extremities, bilateral lower extremities, hands, feet, fingers, toes, fingernails, and toenails. All findings within normal limits unless otherwise noted below.   Relevant physical exam findings are noted in the Assessment and Plan.    Assessment & Plan    HISTORY OF MELANOMA IN SITU. Right mid back paraspinal superior medial. Arising in dysplastic nevus. 04/14/2019. - No evidence of recurrence today - No lymphadenopathy - Recommend regular full body skin exams - Recommend daily broad spectrum sunscreen SPF 30+ to sun-exposed areas, reapply every 2 hours as needed.  - Call if any new or changing lesions are noted between office visits   HISTORY OF DYSPLASTIC NEVUS. Right mid back paraspinal inferior, Excised 07/21/2019, right mid back paraspinal superior lateral excised 04/14/2019, left of midline suprapubic inf to waistline  No evidence of recurrence today Recommend regular full body skin exams Recommend daily broad spectrum sunscreen SPF 30+ to sun-exposed areas, reapply every 2 hours as needed.  Call if any new or  changing lesions are noted between office visits   SKIN CANCER SCREENING PERFORMED TODAY.  ACTINIC DAMAGE - Chronic condition, secondary to cumulative UV/sun exposure - diffuse scaly erythematous macules with underlying dyspigmentation - Recommend daily broad spectrum sunscreen SPF 30+ to sun-exposed areas, reapply every 2 hours as needed.  - Staying in the shade or wearing long sleeves, sun glasses (UVA+UVB protection) and wide brim hats (4-inch brim around the entire circumference of the hat) are also recommended for sun protection.  - Call for new or changing lesions.  LENTIGINES, SEBORRHEIC KERATOSES, HEMANGIOMAS - Benign normal skin lesions - Benign-appearing - Call for any changes  MELANOCYTIC NEVI - Tan-brown and/or pink-flesh-colored symmetric macules and papules - Benign appearing on exam today - Observation - Call clinic for new or changing moles - Recommend daily use of broad spectrum spf 30+ sunscreen to sun-exposed areas.        Return in about 6 months (around 03/11/2024) for TBSE, HxMIS.  I, Lawson Radar, CMA, am acting as scribe for Armida Sans, MD.   Documentation: I have reviewed the above documentation for accuracy and completeness, and I agree with the above.  Armida Sans, MD

## 2023-09-13 ENCOUNTER — Encounter: Payer: Self-pay | Admitting: Family Medicine

## 2023-09-13 ENCOUNTER — Ambulatory Visit: Payer: 59 | Admitting: Family Medicine

## 2023-09-13 VITALS — BP 135/90 | HR 92 | Ht 76.0 in | Wt 322.6 lb

## 2023-09-13 DIAGNOSIS — N3943 Post-void dribbling: Secondary | ICD-10-CM | POA: Diagnosis not present

## 2023-09-13 DIAGNOSIS — M25562 Pain in left knee: Secondary | ICD-10-CM | POA: Diagnosis not present

## 2023-09-13 DIAGNOSIS — E1169 Type 2 diabetes mellitus with other specified complication: Secondary | ICD-10-CM

## 2023-09-13 DIAGNOSIS — N401 Enlarged prostate with lower urinary tract symptoms: Secondary | ICD-10-CM

## 2023-09-13 DIAGNOSIS — Z7984 Long term (current) use of oral hypoglycemic drugs: Secondary | ICD-10-CM

## 2023-09-13 MED ORDER — NAPROXEN 500 MG PO TABS: 500.0000 mg | ORAL_TABLET | Freq: Two times a day (BID) | ORAL | 3 refills | Status: DC

## 2023-09-13 MED ORDER — BACLOFEN 10 MG PO TABS: 10.0000 mg | ORAL_TABLET | Freq: Every evening | ORAL | 3 refills | Status: DC | PRN

## 2023-09-13 MED ORDER — TADALAFIL 5 MG PO TABS
5.0000 mg | ORAL_TABLET | Freq: Every day | ORAL | 3 refills | Status: DC
Start: 2023-09-13 — End: 2023-12-24

## 2023-09-13 MED ORDER — RYBELSUS 3 MG PO TABS: 3.0000 mg | ORAL_TABLET | Freq: Every day | ORAL | 0 refills | Status: DC

## 2023-09-13 MED ORDER — RYBELSUS 7 MG PO TABS
7.0000 mg | ORAL_TABLET | Freq: Every day | ORAL | 0 refills | Status: DC
Start: 2023-09-13 — End: 2023-12-11

## 2023-09-13 NOTE — Progress Notes (Unsigned)
BP (!) 135/90   Pulse 92   Ht 6\' 4"  (1.93 m)   Wt (!) 322 lb 9.6 oz (146.3 kg)   SpO2 96%   BMI 39.27 kg/m    Subjective:    Patient ID: Ethan Perez, male    DOB: [birth year], 52 y.o.   MRN: [id number]  HPI: Ethan Perez is a 52 y.o. male  Chief Complaint  Patient presents with   Diabetes   Foot Pain    Patient says he would like to discuss with provider about a foot pain out of no where. Patient says he first noticed it about a month ago, and says he went to have inserts for his shoes made and it doesn't help a lot.    Knee Pain    Patient says L knee is now bothering him. 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 Patient says the pain is only when he moves it. Patient says when he lies down he has to have it bent or it will hurt.    KNEE PAIN Duration: 3 days Involved knee: L leg Mechanism of injury: kicking Location:outside right above his knee Onset: sudden Severity: severe  Quality:  cramp Frequency: constant Radiation: no Aggravating factors: straightening, weight bearing  Alleviating factors: bending  Status: stable Treatments attempted: rest, APAP, and ibuprofen  Relief with NSAIDs?:  no Weakness with weight bearing or walking: yes Sensation of giving way: no Locking: no Popping: no Bruising: no Swelling: no Redness: no Paresthesias/decreased sensation: no Fevers: no  FOOT PAIN Duration: 3 months, worse recently Involved foot: right Mechanism of injury: unknown Location: middle of foot Onset: gradual  Severity: moderate  Quality:  stabbing, aching, sharp Frequency:  while on it Radiation: no Aggravating factors: standing, walking  Alleviating factors: arch support  Status: better Treatments attempted: arch support, tylenol, ibuprofen  Relief with NSAIDs?:  no Weakness with weight bearing or walking: no Morning stiffness:  no Swelling: no Redness: no Bruising: no Paresthesias / decreased sensation: no  Fevers:no  DIABETES Hypoglycemic episodes:{Blank single:19197::"yes","no"} Polydipsia/polyuria: {Blank single:19197::"yes","no"} Visual disturbance: {Blank single:19197::"yes","no"} Chest pain: {Blank single:19197::"yes","no"} Paresthesias: {Blank single:19197::"yes","no"} Glucose Monitoring: {Blank single:19197::"yes","no"}  Accucheck frequency: {Blank single:19197::"Not Checking","Daily","BID","TID"}  Fasting glucose:  Post prandial:  Evening:  Before meals: Taking Insulin?: {Blank single:19197::"yes","no"}  Long acting insulin:  Short acting insulin: Blood Pressure Monitoring: {Blank single:19197::"not checking","rarely","daily","weekly","monthly","a few times a day","a few times a week","a few times a month"} Retinal Examination: {Blank single:19197::"Up to Date","Not up to Date"} Foot Exam: {Blank single:19197::"Up to Date","Not up to Date"} Diabetic Education: {Blank single:19197::"Completed","Not Completed"} Pneumovax: {Blank single:19197::"Up to Date","Not up to Date","unknown"} Influenza: {Blank single:19197::"Up to Date","Not up to Date","unknown"} Aspirin: {Blank single:19197::"yes","no"}    Relevant past medical, surgical, family and social history reviewed and updated as indicated. Interim medical history since our last visit reviewed. Allergies and medications reviewed and updated.  Review of Systems  Per HPI unless specifically indicated above     Objective:    BP (!) 135/90   Pulse 92   Ht 6\' 4"  (1.93 m)   Wt (!) 322 lb 9.6 oz (146.3 kg)   SpO2 96%   BMI 39.27 kg/m   Wt Readings from Last 3 Encounters:  09/13/23 (!) 322 lb 9.6 oz (146.3 kg)  06/18/23 286 lb (129.7 kg)  03/22/23 298 lb 1.6 oz (135.2 kg)    Physical Exam  Results for orders placed or performed in visit on 06/18/23  Hgb A1c w/o eAG  Result Value Ref Range   Hgb A1c MFr Bld 8.5 (H) 4.8 - 5.6 %  CBC  with Differential/Platelet  Result Value Ref Range   WBC 9.3 3.4 - 10.8 x10E3/uL   RBC 5.74 4.14 - 5.80 x10E6/uL   Hemoglobin 16.8 13.0 - 17.7 g/dL   Hematocrit 54.0 98.1 - 51.0 %   MCV 87 79 - 97 fL   MCH 29.3 26.6 - 33.0 pg   MCHC 33.7 31.5 - 35.7 g/dL   RDW 19.1 47.8 - 29.5 %   Platelets 216 150 - 450 x10E3/uL   Neutrophils 55 Not Estab. %   Lymphs 32 Not Estab. %   Monocytes 8 Not Estab. %   Eos 4 Not Estab. %   Basos 1 Not Estab. %   Neutrophils Absolute 5.1 1.4 - 7.0 x10E3/uL   Lymphocytes Absolute 3.0 0.7 - 3.1 x10E3/uL   Monocytes Absolute 0.8 0.1 - 0.9 x10E3/uL   EOS (ABSOLUTE) 0.4 0.0 - 0.4 x10E3/uL   Basophils Absolute 0.1 0.0 - 0.2 x10E3/uL   Immature Granulocytes 0 Not Estab. %   Immature Grans (Abs) 0.0 0.0 - 0.1 x10E3/uL      Assessment & Plan:   Problem List Items Addressed This Visit   None    Follow up plan: No follow-ups on file.

## 2023-09-14 LAB — HGB A1C W/O EAG: Hgb A1c MFr Bld: 8.9 % — ABNORMAL HIGH (ref 4.8–5.6)

## 2023-09-16 ENCOUNTER — Encounter: Payer: Self-pay | Admitting: Family Medicine

## 2023-09-16 DIAGNOSIS — M79671 Pain in right foot: Secondary | ICD-10-CM

## 2023-09-16 NOTE — Assessment & Plan Note (Signed)
Will start him on 5mg  cipro. Continue to monitor. Call with any concerns.

## 2023-09-16 NOTE — Assessment & Plan Note (Addendum)
Rechecking labs today. Await results. Did not continue his rybelsus- will restart. Sample given today.

## 2023-09-17 ENCOUNTER — Encounter: Payer: Self-pay | Admitting: Dermatology

## 2023-09-20 NOTE — Addendum Note (Signed)
Addended by: Dorcas Carrow on: 09/20/2023 08:29 AM   Modules accepted: Orders

## 2023-09-26 ENCOUNTER — Other Ambulatory Visit: Payer: Self-pay | Admitting: Family Medicine

## 2023-09-26 DIAGNOSIS — N3943 Post-void dribbling: Secondary | ICD-10-CM

## 2023-09-27 NOTE — Telephone Encounter (Signed)
Requested Prescriptions  Pending Prescriptions Disp Refills   tamsulosin (FLOMAX) 0.4 MG CAPS capsule [Pharmacy Med Name: TAMSULOSIN 0.4MG  CAPSULES] 180 capsule 0    Sig: TAKE 2 CAPSULES(0.8 MG) BY MOUTH DAILY     Urology: Alpha-Adrenergic Blocker Failed - 09/26/2023 11:09 PM      Failed - Last BP in normal range    BP Readings from Last 1 Encounters:  09/13/23 (!) 135/90         Passed - PSA in normal range and within 360 days    PSA  Date Value Ref Range Status  05/06/2018 0.4 < OR = 4.0 ng/mL Final    Comment:    The total PSA value from this assay system is  standardized against the WHO standard. The test  result will be approximately 20% lower when compared  to the equimolar-standardized total PSA (Beckman  Coulter). Comparison of serial PSA results should be  interpreted with this fact in mind. . This test was performed using the Siemens  chemiluminescent method. Values obtained from  different assay methods cannot be used interchangeably. PSA levels, regardless of value, should not be interpreted as absolute evidence of the presence or absence of disease.    Prostate Specific Ag, Serum  Date Value Ref Range Status  03/22/2023 0.5 0.0 - 4.0 ng/mL Final    Comment:    Roche ECLIA methodology. According to the American Urological Association, Serum PSA should decrease and remain at undetectable levels after radical prostatectomy. The AUA defines biochemical recurrence as an initial PSA value 0.2 ng/mL or greater followed by a subsequent confirmatory PSA value 0.2 ng/mL or greater. Values obtained with different assay methods or kits cannot be used interchangeably. Results cannot be interpreted as absolute evidence of the presence or absence of malignant disease.          Passed - Valid encounter within last 12 months    Recent Outpatient Visits           2 weeks ago Acute pain of left knee   [place] [facility] [place], [place] P, DO   3 months  ago Type 2 diabetes mellitus with other specified complication, without long-term current use of insulin (HCC)   [place] [facility] [place], Megan P, DO   6 months ago Routine general medical examination at a health care facility   [facility] [place], [place] P, DO   1 year ago Type 2 diabetes mellitus with other specified complication, without long-term current use of insulin [facility])   [place] [facility] [place], [place], DO   1 year ago Torticollis   [place] [facility] Dorcas Carrow, DO       Future Appointments             In 2 months Laural Benes, Oralia Rud, DO [facility]   In 6 months Deirdre Evener, MD [facility]

## 2023-09-30 ENCOUNTER — Other Ambulatory Visit: Payer: Self-pay | Admitting: Nurse Practitioner

## 2023-09-30 ENCOUNTER — Ambulatory Visit: Payer: 59 | Admitting: Family Medicine

## 2023-09-30 ENCOUNTER — Encounter: Payer: Self-pay | Admitting: Family Medicine

## 2023-09-30 VITALS — BP 122/74 | HR 85 | Ht 76.0 in | Wt 302.6 lb

## 2023-09-30 DIAGNOSIS — M79671 Pain in right foot: Secondary | ICD-10-CM | POA: Diagnosis not present

## 2023-09-30 MED ORDER — KETOROLAC TROMETHAMINE 60 MG/2ML IM SOLN
60.0000 mg | Freq: Once | INTRAMUSCULAR | Status: AC
Start: 2023-09-30 — End: 2023-09-30
  Administered 2023-09-30: 60 mg via INTRAMUSCULAR

## 2023-09-30 NOTE — Progress Notes (Signed)
BP 122/74   Pulse 85   Ht 6\' 4"  (1.93 m)   Wt (!) 302 lb 9.6 oz (137.3 kg)   SpO2 94%   BMI 36.83 kg/m    Subjective:    Patient ID: Ethan Perez, male    DOB: 10-28-1971, 52 y.o.   MRN: [id number]  HPI: Ethan Perez is a 52 y.o. male  Chief Complaint  Patient presents with   Foot Pain    Patient says he was on a Ladder on Saturday, he went to [place] and says he heard a "pop" and says he has not been able to walk on it since. Patient declines being seeing any where for the foot pain. Patient says he has been using ice to help with the pain and discomfort. Patient says he is currently on an anti-inflammatory.    FOOT PAIN Duration: 2 days Involved foot: right Mechanism of injury: twisted and felt a pop Location: heel into the middle of his foot Onset: sudden  Severity: severe  Quality:  sharp with weight on it, otherwise constant aching Frequency: constant Radiation: none Aggravating factors: weight bearing and walking  Alleviating factors: sitting,   Status: stable Treatments attempted: ice, rest, naproxen  Relief with NSAIDs?:  mild Weakness with weight bearing or walking: no Morning stiffness: no Swelling: yes Redness: no Bruising: no Paresthesias / decreased sensation: no  Fevers:no   Relevant past medical, surgical, family and social history reviewed and updated as indicated. Interim medical history since our last visit reviewed. Allergies and medications reviewed and updated.  Review of Systems  Constitutional: Negative.   Respiratory: Negative.    Cardiovascular: Negative.   Gastrointestinal: Negative.   Musculoskeletal: Negative.   Neurological: Negative.   Psychiatric/Behavioral: Negative.      Per HPI unless specifically indicated above     Objective:    BP 122/74   Pulse 85   Ht 6\' 4"  (1.93 m)   Wt (!) 302 lb 9.6 oz (137.3 kg)   SpO2 94%   BMI 36.83 kg/m   Wt Readings from Last 3 Encounters:  09/30/23 (!) 302 lb 9.6 oz  (137.3 kg)  09/13/23 (!) 322 lb 9.6 oz (146.3 kg)  06/18/23 286 lb (129.7 kg)    Physical Exam Vitals and nursing note reviewed.  Constitutional:      General: He is not in acute distress.    Appearance: Normal appearance. He is not ill-appearing, toxic-appearing or diaphoretic.  HENT:     Head: Normocephalic and atraumatic.     Right Ear: External ear normal.     Left Ear: External ear normal.     Nose: Nose normal.     Mouth/Throat:     Mouth: Mucous membranes are moist.     Pharynx: Oropharynx is clear.  Eyes:     General: No scleral icterus.       Right eye: No discharge.        Left eye: No discharge.     Extraocular Movements: Extraocular movements intact.     Conjunctiva/sclera: Conjunctivae normal.     Pupils: Pupils are equal, round, and reactive to light.  Cardiovascular:     Rate and Rhythm: Normal rate and regular rhythm.     Pulses: Normal pulses.     Heart sounds: Normal heart sounds. No murmur heard.    No friction rub. No gallop.  Pulmonary:     Effort: Pulmonary effort is normal. No respiratory distress.     Breath sounds: Normal  breath sounds. No stridor. No wheezing, rhonchi or rales.  Chest:     Chest wall: No tenderness.  Musculoskeletal:        General: Tenderness (bottom of R foot, no lump or redness) present. Normal range of motion.     Cervical back: Normal range of motion and neck supple.  Skin:    General: Skin is warm and dry.     Capillary Refill: Capillary refill takes less than 2 seconds.     Coloration: Skin is not jaundiced or pale.     Findings: No bruising, erythema, lesion or rash.  Neurological:     General: No focal deficit present.     Mental Status: He is alert and oriented to person, place, and time. Mental status is at baseline.  Psychiatric:        Mood and Affect: Mood normal.        Behavior: Behavior normal.        Thought Content: Thought content normal.        Judgment: Judgment normal.     Results for orders placed  or performed in visit on 09/13/23  Hgb A1c w/o eAG  Result Value Ref Range   Hgb A1c MFr Bld 8.9 (H) 4.8 - 5.6 %      Assessment & Plan:   Problem List Items Addressed This Visit   None Visit Diagnoses     Right foot pain    -  Primary   Toradol shot today. Will try to get into podiatry faster. Call with any concerns. Out of work note given.   Relevant Medications   ketorolac (TORADOL) injection 60 mg (Completed)   Other Relevant Orders   Ambulatory referral to Podiatry        Follow up plan: Return if symptoms worsen or fail to improve.

## 2023-10-01 NOTE — Telephone Encounter (Signed)
Requested medication (s) are due for refill today:   Provider to review  Requested medication (s) are on the active medication list:   Yes  Future visit scheduled:   Yes 12/18   Last ordered: 07/01/2023 #12, 0 refills  Non delegated refill    Requested Prescriptions  Pending Prescriptions Disp Refills   Vitamin D, Ergocalciferol, (DRISDOL) 1.25 MG (50000 UNIT) CAPS capsule [Pharmacy Med Name: VITAMIN D2 50,000IU (ERGO) CAP RX] 12 capsule 0    Sig: TAKE 1 CAPSULE BY MOUTH EVERY 7 DAYS     Endocrinology:  Vitamins - Vitamin D Supplementation 2 Failed - 09/30/2023  8:09 AM      Failed - Manual Review: Route requests for 50,000 IU strength to the provider      Failed - Vitamin D in normal range and within 360 days    Vit D, 25-Hydroxy  Date Value Ref Range Status  03/22/2023 26.6 (L) 30.0 - 100.0 ng/mL Final    Comment:    Vitamin D deficiency has been defined by the Institute of Medicine and an Endocrine Society practice guideline as a level of serum 25-OH vitamin D less than 20 ng/mL (1,2). The Endocrine Society went on to further define vitamin D insufficiency as a level between 21 and 29 ng/mL (2). 1. IOM (Institute of Medicine). 2010. Dietary reference    intakes for calcium and D. [place]: The    [employer]. 2. Holick MF, Binkley [place], Bischoff-Ferrari HA, et al.    Evaluation, treatment, and prevention of vitamin D    deficiency: an Endocrine Society clinical practice    guideline. JCEM. 2011 Jul; 96(7):1911-30.          Passed - Ca in normal range and within 360 days    Calcium  Date Value Ref Range Status  03/22/2023 9.4 8.7 - 10.2 mg/dL Final   Calcium, Total  Date Value Ref Range Status  07/09/2013 9.0 8.5 - 10.1 mg/dL Final         Passed - Valid encounter within last 12 months    Recent Outpatient Visits           Yesterday Right foot pain   [place] [facility], [place] P, DO   2 weeks ago Acute pain of left knee    [place] [facility], Megan P, DO   3 months ago Type 2 diabetes mellitus with other specified complication, without long-term current use of insulin (HCC)   [place] [facility] [place], Megan P, DO   6 months ago Routine general medical examination at a health care facility   [facility] [place], [place] P, DO   1 year ago Type 2 diabetes mellitus with other specified complication, without long-term current use of insulin [facility])   [facility] Dorcas Carrow, DO       Future Appointments             In 2 months Laural Benes, Oralia Rud, DO [place] [facility], [facility]   In 5 months Deirdre Evener, MD [facility]

## 2023-10-03 ENCOUNTER — Ambulatory Visit: Payer: 59 | Admitting: Podiatry

## 2023-10-15 ENCOUNTER — Ambulatory Visit: Payer: 59 | Admitting: Podiatry

## 2023-10-24 ENCOUNTER — Other Ambulatory Visit: Payer: Self-pay | Admitting: Family Medicine

## 2023-10-24 DIAGNOSIS — N401 Enlarged prostate with lower urinary tract symptoms: Secondary | ICD-10-CM

## 2023-10-24 NOTE — Telephone Encounter (Signed)
Refused Flomax because it's being requested too soon.

## 2023-12-11 ENCOUNTER — Ambulatory Visit: Payer: 59 | Admitting: Family Medicine

## 2023-12-11 ENCOUNTER — Encounter: Payer: Self-pay | Admitting: Family Medicine

## 2023-12-11 VITALS — BP 115/77 | HR 88 | Wt 302.8 lb

## 2023-12-11 DIAGNOSIS — N529 Male erectile dysfunction, unspecified: Secondary | ICD-10-CM | POA: Diagnosis not present

## 2023-12-11 DIAGNOSIS — N401 Enlarged prostate with lower urinary tract symptoms: Secondary | ICD-10-CM

## 2023-12-11 DIAGNOSIS — E7849 Other hyperlipidemia: Secondary | ICD-10-CM | POA: Diagnosis not present

## 2023-12-11 DIAGNOSIS — E1169 Type 2 diabetes mellitus with other specified complication: Secondary | ICD-10-CM | POA: Diagnosis not present

## 2023-12-11 DIAGNOSIS — Z7984 Long term (current) use of oral hypoglycemic drugs: Secondary | ICD-10-CM

## 2023-12-11 DIAGNOSIS — J40 Bronchitis, not specified as acute or chronic: Secondary | ICD-10-CM

## 2023-12-11 DIAGNOSIS — N3943 Post-void dribbling: Secondary | ICD-10-CM

## 2023-12-11 LAB — BAYER DCA HB A1C WAIVED: HB A1C (BAYER DCA - WAIVED): 10.3 % — ABNORMAL HIGH (ref 4.8–5.6)

## 2023-12-11 MED ORDER — PREDNISONE 50 MG PO TABS: 50.0000 mg | ORAL_TABLET | Freq: Every day | ORAL | 0 refills | Status: DC

## 2023-12-11 MED ORDER — RYBELSUS 14 MG PO TABS: 14.0000 mg | ORAL_TABLET | Freq: Every day | ORAL | 1 refills | Status: DC

## 2023-12-11 MED ORDER — ALBUTEROL SULFATE (2.5 MG/3ML) 0.083% IN NEBU: 2.5000 mg | INHALATION_SOLUTION | Freq: Four times a day (QID) | RESPIRATORY_TRACT | 1 refills | Status: DC | PRN

## 2023-12-11 NOTE — Assessment & Plan Note (Signed)
No better on cialis and flomax. Would like to see urology. Referral placed today.

## 2023-12-11 NOTE — Assessment & Plan Note (Signed)
Not doing well with A1c up to 10.3 from 8.5. Will increase his rybelsus to 14mg  and recheck in 3 months. Call with any concerns.

## 2023-12-11 NOTE — Assessment & Plan Note (Signed)
Rechecking labs today. Await results. Treat as needed.  °

## 2023-12-11 NOTE — Progress Notes (Signed)
BP 115/77   Pulse 88   Wt (!) 302 lb 12.8 oz (137.3 kg)   SpO2 96%   BMI 36.86 kg/m    Subjective:    Patient ID: Ethan Perez, male    DOB: Jan 07, 1971, 52 y.o.   MRN: [id number]  HPI: Ethan Perez is a 51 y.o. male  Chief Complaint  Patient presents with   Diabetes   DIABETES Hypoglycemic episodes:no Polydipsia/polyuria: no Visual disturbance: no Chest pain: no Paresthesias: no Glucose Monitoring: no  Accucheck frequency: Not Checking Taking Insulin?: no Blood Pressure Monitoring: not checking Retinal Examination: Not up to Date Foot Exam: Up to Date Diabetic Education: Completed Pneumovax: Up to Date Influenza: Not up to Date Aspirin: no  HYPERLIPIDEMIA Hyperlipidemia status: excellent compliance Satisfied with current treatment?  yes Side effects:  Not on anything Past cholesterol meds: none Supplements: none Aspirin:  no The 10-year ASCVD risk score (Arnett DK, et al., 2019) is: 10.3%   Values used to calculate the score:     Age: 31 years     Sex: Male     Is Non-Hispanic African American: No     Diabetic: Yes     Tobacco smoker: No     Systolic Blood Pressure: 115 mmHg     Is BP treated: No     HDL Cholesterol: 36 mg/dL     Total Cholesterol: 220 mg/dL Chest pain:  no Coronary artery disease:  no  UPPER RESPIRATORY TRACT INFECTION Duration: about a week Worst symptom: cough and congestion Fever: no Cough: yes Shortness of breath: no Wheezing: yes Chest pain: no Chest tightness: yes Chest congestion: yes Nasal congestion: yes Runny nose: no Post nasal drip: no Sneezing: no Sore throat: no Swollen glands: no Sinus pressure: yes Headache: no Face pain: no Toothache: no Ear pain: no  Ear pressure: no  Eyes red/itching:no Eye drainage/crusting: no  Vomiting: no Rash: no Fatigue: yes Sick contacts: no Strep contacts: no  Context: stable Recurrent sinusitis: no Relief with OTC cold/cough medications: no  Treatments  attempted: inhaler   BPH BPH status: stable Satisfied with current treatment?: no Medication side effects: no Medication compliance: excellent compliance Duration: chronic Nocturia: 2-3x per night Urinary frequency:no Incomplete voiding: yes Urgency: no Weak urinary stream: yes Straining to start stream: yes Dysuria: no Onset: gradual Severity: moderate  ED has not improved at all with the cialis.      Relevant past medical, surgical, family and social history reviewed and updated as indicated. Interim medical history since our last visit reviewed. Allergies and medications reviewed and updated.  Review of Systems  Constitutional: Negative.   Respiratory: Negative.    Cardiovascular: Negative.   Gastrointestinal: Negative.   Musculoskeletal:  Positive for arthralgias. Negative for back pain, gait problem, joint swelling, myalgias, neck pain and neck stiffness.  Skin: Negative.   Psychiatric/Behavioral: Negative.      Per HPI unless specifically indicated above     Objective:    BP 115/77   Pulse 88   Wt (!) 302 lb 12.8 oz (137.3 kg)   SpO2 96%   BMI 36.86 kg/m   Wt Readings from Last 3 Encounters:  12/11/23 (!) 302 lb 12.8 oz (137.3 kg)  09/30/23 (!) 302 lb 9.6 oz (137.3 kg)  09/13/23 (!) 322 lb 9.6 oz (146.3 kg)    Physical Exam Vitals and nursing note reviewed.  Constitutional:      General: He is not in acute distress.    Appearance: Normal appearance.  He is obese. He is not ill-appearing, toxic-appearing or diaphoretic.  HENT:     Head: Normocephalic and atraumatic.     Right Ear: External ear normal.     Left Ear: External ear normal.     Nose: Nose normal.     Mouth/Throat:     Mouth: Mucous membranes are moist.     Pharynx: Oropharynx is clear.  Eyes:     General: No scleral icterus.       Right eye: No discharge.        Left eye: No discharge.     Extraocular Movements: Extraocular movements intact.     Conjunctiva/sclera: Conjunctivae  normal.     Pupils: Pupils are equal, round, and reactive to light.  Cardiovascular:     Rate and Rhythm: Normal rate and regular rhythm.     Pulses: Normal pulses.     Heart sounds: Normal heart sounds. No murmur heard.    No friction rub. No gallop.  Pulmonary:     Effort: Pulmonary effort is normal. No respiratory distress.     Breath sounds: No stridor. Wheezing present. No rhonchi or rales.  Chest:     Chest wall: No tenderness.  Musculoskeletal:        General: Normal range of motion.     Cervical back: Normal range of motion and neck supple.  Skin:    General: Skin is warm and dry.     Capillary Refill: Capillary refill takes less than 2 seconds.     Coloration: Skin is not jaundiced or pale.     Findings: No bruising, erythema, lesion or rash.  Neurological:     General: No focal deficit present.     Mental Status: He is alert and oriented to person, place, and time. Mental status is at baseline.  Psychiatric:        Mood and Affect: Mood normal.        Behavior: Behavior normal.        Thought Content: Thought content normal.        Judgment: Judgment normal.     Results for orders placed or performed in visit on 12/11/23  Bayer DCA Hb A1c Waived   Collection Time: 12/11/23  8:25 AM  Result Value Ref Range   HB A1C (BAYER DCA - WAIVED) 10.3 (H) 4.8 - 5.6 %      Assessment & Plan:   Problem List Items Addressed This Visit       Endocrine   Type 2 diabetes mellitus with other specified complication (HCC) - Primary   Not doing well with A1c up to 10.3 from 8.5. Will increase his rybelsus to 14mg  and recheck in 3 months. Call with any concerns.       Relevant Medications   Semaglutide (RYBELSUS) 14 MG TABS   Other Relevant Orders   Bayer DCA Hb A1c Waived (Completed)   CBC with Differential/Platelet   Comprehensive metabolic panel   Lipid Panel w/o Chol/HDL Ratio     Genitourinary   Benign prostatic hyperplasia with post-void dribbling   No better on  cialis and flomax. Would like to see urology. Referral placed today.      Relevant Orders   Ambulatory referral to Urology     Other   Hyperlipidemia (Chronic)   Rechecking labs today. Await results. Treat as needed.       Other Visit Diagnoses       Erectile dysfunction, unspecified erectile dysfunction type  No better with low dose cialis for BPH. Will refer to urology. Call with any concerns.   Relevant Orders   Ambulatory referral to Urology     Bronchitis       Will treat with burst of prednisone. Call with any concerns or if not getting better.        Follow up plan: Return in about 3 months (around 03/10/2024).

## 2023-12-12 ENCOUNTER — Encounter: Payer: Self-pay | Admitting: Family Medicine

## 2023-12-12 LAB — CBC WITH DIFFERENTIAL/PLATELET
Basophils Absolute: 0.1 x10E3/uL (ref 0.0–0.2)
Basos: 1 %
EOS (ABSOLUTE): 0.3 x10E3/uL (ref 0.0–0.4)
Eos: 6 %
Hematocrit: 49.4 % (ref 37.5–51.0)
Hemoglobin: 16.2 g/dL (ref 13.0–17.7)
Immature Grans (Abs): 0 x10E3/uL (ref 0.0–0.1)
Immature Granulocytes: 0 %
Lymphocytes Absolute: 2.1 x10E3/uL (ref 0.7–3.1)
Lymphs: 34 %
MCH: 29.3 pg (ref 26.6–33.0)
MCHC: 32.8 g/dL (ref 31.5–35.7)
MCV: 89 fL (ref 79–97)
Monocytes Absolute: 0.5 x10E3/uL (ref 0.1–0.9)
Monocytes: 9 %
Neutrophils Absolute: 3.1 x10E3/uL (ref 1.4–7.0)
Neutrophils: 50 %
Platelets: 184 x10E3/uL (ref 150–450)
RBC: 5.53 x10E6/uL (ref 4.14–5.80)
RDW: 12.8 % (ref 11.6–15.4)
WBC: 6.1 x10E3/uL (ref 3.4–10.8)

## 2023-12-12 LAB — LIPID PANEL W/O CHOL/HDL RATIO
Cholesterol, Total: 186 mg/dL (ref 100–199)
HDL: 34 mg/dL — ABNORMAL LOW
LDL Chol Calc (NIH): 114 mg/dL — ABNORMAL HIGH (ref 0–99)
Triglycerides: 217 mg/dL — ABNORMAL HIGH (ref 0–149)
VLDL Cholesterol Cal: 38 mg/dL (ref 5–40)

## 2023-12-12 LAB — COMPREHENSIVE METABOLIC PANEL WITH GFR
ALT: 38 IU/L (ref 0–44)
AST: 28 IU/L (ref 0–40)
Albumin: 4.1 g/dL (ref 3.8–4.9)
Alkaline Phosphatase: 84 IU/L (ref 44–121)
BUN/Creatinine Ratio: 10 (ref 9–20)
BUN: 11 mg/dL (ref 6–24)
Bilirubin Total: 0.4 mg/dL (ref 0.0–1.2)
CO2: 23 mmol/L (ref 20–29)
Calcium: 8.8 mg/dL (ref 8.7–10.2)
Chloride: 101 mmol/L (ref 96–106)
Creatinine, Ser: 1.08 mg/dL (ref 0.76–1.27)
Globulin, Total: 2.7 g/dL (ref 1.5–4.5)
Glucose: 200 mg/dL — ABNORMAL HIGH (ref 70–99)
Potassium: 4.2 mmol/L (ref 3.5–5.2)
Sodium: 141 mmol/L (ref 134–144)
Total Protein: 6.8 g/dL (ref 6.0–8.5)
eGFR: 83 mL/min/1.73

## 2023-12-20 ENCOUNTER — Other Ambulatory Visit: Payer: Self-pay | Admitting: Family Medicine

## 2023-12-23 ENCOUNTER — Other Ambulatory Visit: Payer: Self-pay | Admitting: Family Medicine

## 2023-12-23 DIAGNOSIS — N401 Enlarged prostate with lower urinary tract symptoms: Secondary | ICD-10-CM

## 2023-12-24 ENCOUNTER — Ambulatory Visit: Payer: Self-pay

## 2023-12-24 NOTE — Telephone Encounter (Signed)
 Requested Prescriptions  Pending Prescriptions Disp Refills   tadalafil  (CIALIS ) 5 MG tablet [Pharmacy Med Name: TADALAFIL  5MG  TABLETS] 30 tablet 3    Sig: TAKE 1 TABLET(5 MG) BY MOUTH DAILY     Urology: Erectile Dysfunction Agents Passed - 12/24/2023  4:45 PM      Passed - AST in normal range and within 360 days    AST  Date Value Ref Range Status  12/11/2023 28 0 - 40 IU/L Final         Passed - ALT in normal range and within 360 days    ALT  Date Value Ref Range Status  12/11/2023 38 0 - 44 IU/L Final         Passed - Last BP in normal range    BP Readings from Last 1 Encounters:  12/11/23 115/77         Passed - Valid encounter within last 12 months    Recent Outpatient Visits           1 week ago Type 2 diabetes mellitus with other specified complication, without long-term current use of insulin (HCC)   [place] [facility] [place], Megan P, DO   2 months ago Right foot pain   [facility] [place], Megan P, DO   3 months ago Acute pain of left knee   [place] [facility] [place], Megan P, DO   6 months ago Type 2 diabetes mellitus with other specified complication, without long-term current use of insulin (HCC)   [place] [facility] [place], Megan P, DO   9 months ago Routine general medical examination at a health care facility   [facility] Vicci Duwaine SQUIBB, DO       Future Appointments             In 3 months Hester Alm BROCKS, MD [facility]

## 2023-12-24 NOTE — Telephone Encounter (Signed)
      Chief Complaint: Seen 12/11/23, I'm still coughing and SOB with exertion. Wheezing. Symptoms: Above Frequency: 3 weeks Pertinent Negatives: Patient denies fever Disposition: [] ED /[x] Urgent Care (no appt availability in office) / [] Appointment(In office/virtual)/ []  [place] [facility]/ [] Home Care/ [] Refused Recommended Disposition /[] [place] [facility]/ []  Follow-up with PCP Additional Notes: Declines [place] [facility]V, going to UC.  Reason for Disposition  [1] MILD difficulty breathing (e.g., minimal/no SOB at rest, SOB with walking, pulse <100) AND [2] still present when not coughing  Answer Assessment - Initial Assessment Questions 1. ONSET: When did the cough begin?      3 weeks 2. SEVERITY: How bad is the cough today?      Severe 3. SPUTUM: Describe the color of your sputum (none, dry cough; clear, white, yellow, green)     Yellow 4. HEMOPTYSIS: Are you coughing up any blood? If so ask: How much? (flecks, streaks, tablespoons, etc.)     No 5. DIFFICULTY BREATHING: Are you having difficulty breathing? If Yes, ask: How bad is it? (e.g., mild, moderate, severe)    - MILD: No SOB at rest, mild SOB with walking, speaks normally in sentences, can lie down, no retractions, pulse < 100.    - MODERATE: SOB at rest, SOB with minimal exertion and prefers to sit, cannot lie down flat, speaks in phrases, mild retractions, audible wheezing, pulse 100-120.    - SEVERE: Very SOB at rest, speaks in single words, struggling to breathe, sitting hunched forward, retractions, pulse > 120      Mild 6. FEVER: Do you have a fever? If Yes, ask: What is your temperature, how was it measured, and when did it start?     No 7. CARDIAC HISTORY: Do you have any history of heart disease? (e.g., heart attack, congestive heart failure)      No 8. LUNG HISTORY: Do you have any history of lung disease?  (e.g., pulmonary embolus, asthma, emphysema)     Bronchitis 9. PE RISK  FACTORS: Do you have a history of blood clots? (or: recent major surgery, recent prolonged travel, bedridden)     No 10. OTHER SYMPTOMS: Do you have any other symptoms? (e.g., runny nose, wheezing, chest pain)       Wheezing 11. PREGNANCY: Is there any chance you are pregnant? When was your last menstrual period?       na 12. TRAVEL: Have you traveled out of the country in the last month? (e.g., travel history, exposures)       No  Protocols used: Cough - Acute Productive-A-AH

## 2023-12-27 NOTE — Telephone Encounter (Signed)
 Requested Prescriptions  Pending Prescriptions Disp Refills   tamsulosin  (FLOMAX ) 0.4 MG CAPS capsule [Pharmacy Med Name: TAMSULOSIN  0.4MG  CAPSULES] 180 capsule 0    Sig: TAKE 2 CAPSULES(0.8 MG) BY MOUTH DAILY     Urology: Alpha-Adrenergic Blocker Passed - 12/27/2023 11:01 AM      Passed - PSA in normal range and within 360 days    PSA  Date Value Ref Range Status  05/06/2018 0.4 < OR = 4.0 ng/mL Final    Comment:    The total PSA value from this assay system is  standardized against the WHO standard. The test  result will be approximately 20% lower when compared  to the equimolar-standardized total PSA (Beckman  Coulter). Comparison of serial PSA results should be  interpreted with this fact in mind. . This test was performed using the Siemens  chemiluminescent method. Values obtained from  different assay methods cannot be used interchangeably. PSA levels, regardless of value, should not be interpreted as absolute evidence of the presence or absence of disease.    Prostate Specific Ag, Serum  Date Value Ref Range Status  03/22/2023 0.5 0.0 - 4.0 ng/mL Final    Comment:    Roche ECLIA methodology. According to the American Urological Association, Serum PSA should decrease and remain at undetectable levels after radical prostatectomy. The AUA defines biochemical recurrence as an initial PSA value 0.2 ng/mL or greater followed by a subsequent confirmatory PSA value 0.2 ng/mL or greater. Values obtained with different assay methods or kits cannot be used interchangeably. Results cannot be interpreted as absolute evidence of the presence or absence of malignant disease.          Passed - Last BP in normal range    BP Readings from Last 1 Encounters:  12/11/23 115/77         Passed - Valid encounter within last 12 months    Recent Outpatient Visits           2 weeks ago Type 2 diabetes mellitus with other specified complication, without long-term current use of insulin  (HCC)   [place] [facility] [place], Megan P, DO   2 months ago Right foot pain   [place] [facility] [place], Megan P, DO   3 months ago Acute pain of left knee   [place] [facility] [place], Megan P, DO   6 months ago Type 2 diabetes mellitus with other specified complication, without long-term current use of insulin (HCC)   [place] [place], Megan P, DO   9 months ago Routine general medical examination at a health care facility   [place] [facility] Vic[place] Duwaine SQUIBB, DO       Future Appointments             In 2 months Hester Alm BROCKS, MD [place] [facility] [place] [facility]

## 2024-01-15 ENCOUNTER — Other Ambulatory Visit: Payer: Self-pay | Admitting: Family Medicine

## 2024-01-15 ENCOUNTER — Telehealth: Payer: Self-pay | Admitting: Family Medicine

## 2024-01-15 DIAGNOSIS — N3943 Post-void dribbling: Secondary | ICD-10-CM

## 2024-01-15 NOTE — Telephone Encounter (Signed)
[facility] called and spoke to Rives, [facility] about the refill(s) tamsulosin requested. Advised it was sent on 12/27/23 #180/0 refill(s). He states they have not received script for this rx. Asked if I could give verbal order, verbal order was given and will process for pt to pick up. No further assistance noted.   tamsulosin (FLOMAX) 0.4 MG CAPS capsule 180 capsule 0 12/27/2023 --   Sig: TAKE 2 CAPSULES(0.8 MG) BY MOUTH DAILY   Sent to pharmacy as: tamsulosin (FLOMAX) 0.4 MG Cap capsule   E-Prescribing Status: Receipt confirmed by pharmacy (12/27/2023 11:02 AM EST)

## 2024-01-15 NOTE — Telephone Encounter (Signed)
Medication Refill -  Most Recent Primary Care Visit:  Provider: Dorcas Carrow  Department: [facility]  Visit Type: OFFICE VISIT  Date: 12/11/2023  Medication: tamsulosin (FLOMAX) 0.4 MG CAPS capsule  2nd REQUEST/Shows received at pharmacy on Jan 3, but they say they dont have it  Has the patient contacted their pharmacy? yes  (Agent: If yes, when and what did the pharmacy advise?)contact pcp  Is this the correct pharmacy for this prescription? yes  This is the patient's preferred pharmacy:  [id number] - [place], [place] - [address] AT [facility] OF [address] [facility] [address] [place] [address] Phone: [phone] Fax: [phone]   Has the prescription been filled recently? no  Is the patient out of the medication? yes  Has the patient been seen for an appointment in the last year OR does the patient have an upcoming appointment? yes  Can we respond through MyChart? yes  Agent: Please be advised that Rx refills may take up to 3 business days. We ask that you follow-up with your pharmacy.

## 2024-01-15 NOTE — Telephone Encounter (Signed)
Pharmacy needs to be called to confirm received, was sent 12/27/23.

## 2024-01-20 ENCOUNTER — Other Ambulatory Visit: Payer: Self-pay | Admitting: Family Medicine

## 2024-01-23 NOTE — Telephone Encounter (Signed)
Requested Prescriptions  Pending Prescriptions Disp Refills   baclofen (LIORESAL) 10 MG tablet [Pharmacy Med Name: BACLOFEN 10MG  TABLETS] 90 tablet 0    Sig: TAKE 1 TABLET(10 MG) BY MOUTH AT BEDTIME AS NEEDED FOR MUSCLE SPASMS     Analgesics:  Muscle Relaxants - baclofen Passed - 01/23/2024  7:54 AM      Passed - Cr in normal range and within 180 days    Creat  Date Value Ref Range Status  01/23/2019 0.86 0.60 - 1.35 mg/dL Final   Creatinine, Ser  Date Value Ref Range Status  12/11/2023 1.08 0.76 - 1.27 mg/dL Final         Passed - eGFR is 30 or above and within 180 days    GFR, Est African American  Date Value Ref Range Status  01/23/2019 120 > OR = 60 mL/min/1.95m2 Final   GFR calc Af Amer  Date Value Ref Range Status  04/05/2020 120 >59 mL/min/1.73 Final   GFR, Est Non African American  Date Value Ref Range Status  01/23/2019 103 > OR = 60 mL/min/1.51m2 Final   GFR calc non Af Amer  Date Value Ref Range Status  04/05/2020 104 >59 mL/min/1.73 Final   eGFR  Date Value Ref Range Status  12/11/2023 83 >59 mL/min/1.73 Final         Passed - Valid encounter within last 6 months    Recent Outpatient Visits           1 month ago Type 2 diabetes mellitus with other specified complication, without long-term current use of insulin (HCC)   [facility] [place], Megan P, DO   3 months ago Right foot pain   [facility] [place], Megan P, DO   4 months ago Acute pain of left knee   [facility] [place], Megan P, DO   7 months ago Type 2 diabetes mellitus with other specified complication, without long-term current use of insulin (HCC)   [facility] [place], Megan P, DO   10 months ago Routine general medical examination at a health care facility   [facility] Dorcas Carrow, DO       Future Appointments             In 1 month Dorcas Carrow, DO  [facility]   In 2 months Deirdre Evener, MD [facility]

## 2024-02-07 ENCOUNTER — Encounter: Payer: Self-pay | Admitting: Pediatrics

## 2024-02-07 ENCOUNTER — Ambulatory Visit: Payer: 59 | Admitting: Pediatrics

## 2024-02-07 VITALS — BP 117/80 | HR 76 | Ht 76.0 in | Wt 276.0 lb

## 2024-02-07 DIAGNOSIS — J069 Acute upper respiratory infection, unspecified: Secondary | ICD-10-CM | POA: Diagnosis not present

## 2024-02-07 DIAGNOSIS — Z1330 Encounter for screening examination for mental health and behavioral disorders, unspecified: Secondary | ICD-10-CM | POA: Diagnosis not present

## 2024-02-07 LAB — VERITOR FLU A/B WAIVED
Influenza A: NEGATIVE
Influenza B: NEGATIVE

## 2024-02-07 MED ORDER — AZITHROMYCIN 250 MG PO TABS: ORAL_TABLET | ORAL | 0 refills | Status: AC

## 2024-02-07 NOTE — Progress Notes (Signed)
Office Visit  BP 117/80   Pulse 76   Ht 6\' 4"  (1.93 m)   Wt 276 lb (125.2 kg)   SpO2 98%   BMI 33.60 kg/m    Subjective:    Patient ID: Ethan Perez, male    DOB: 1971/06/20, 53 y.o.   MRN: [id number]  HPI: Ethan Perez is a 53 y.o. male  Chief Complaint  Patient presents with   Cough    Cough, sinus-seen video appt Monday prescribe    Pt presents with a persistent cough and recent illness. His wife is concerned about his health, particularly regarding his upcoming work trip, which involves scuba diving. Has had persistent cough.  He has been experiencing a persistent cough that initially improved with a prednisone taper. However, he became ill again on Sunday, February 02, 2024, with symptoms including sinus congestion. During a video visit, he was prescribed a steroid nasal spray and cough syrup, but no swab tests were conducted at that time. The cough is mostly resolved, with only occasional coughing and sometimes coughing up a little phlegm.  He notes feeling better the day after taking the cough syrup, although it causes drowsiness, so he only takes it at night after work. 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 He is concerned about his ability to participate due to his symptoms. No other symptoms are reported aside from sinus congestion and occasional cough with phlegm.  Relevant past medical, surgical, family and social history reviewed and updated as indicated. Interim medical history since our last visit reviewed. Allergies and medications reviewed and updated.  ROS per HPI unless specifically indicated above     Objective:    BP 117/80   Pulse 76   Ht 6\' 4"  (1.93 m)   Wt 276 lb (125.2 kg)   SpO2 98%   BMI 33.60 kg/m   Wt Readings from Last 3 Encounters:  02/07/24 276 lb (125.2 kg)  12/11/23 (!) 302 lb 12.8 oz (137.3 kg)  09/30/23 (!) 302 lb 9.6 oz (137.3 kg)     Physical  Exam Constitutional:      Appearance: Normal appearance.  HENT:     Head: Normocephalic and atraumatic.     Nose: No congestion or rhinorrhea.  Eyes:     Pupils: Pupils are equal, round, and reactive to light.  Cardiovascular:     Rate and Rhythm: Normal rate and regular rhythm.     Pulses: Normal pulses.     Heart sounds: Normal heart sounds.  Pulmonary:     Effort: Pulmonary effort is normal.     Breath sounds: Normal breath sounds.  Abdominal:     General: Abdomen is flat.     Palpations: Abdomen is soft.  Musculoskeletal:        General: Normal range of motion.     Cervical back: Normal range of motion.  Skin:    General: Skin is warm and dry.  Neurological:     General: No focal deficit present.     Mental Status: He is alert. Mental status is at baseline.  Psychiatric:        Mood and Affect: Mood normal.        Behavior: Behavior normal.         02/07/2024    1:32 PM 12/11/2023    8:11 AM 09/13/2023    2:43 PM 06/18/2023    4:22 PM 03/22/2023  1:10 PM  Depression screen PHQ 2/9  Decreased Interest 0 0 0 0 0  Down, Depressed, Hopeless 0 0 0 0 0  PHQ - 2 Score 0 0 0 0 0  Altered sleeping 0 0 0 0 0  Tired, decreased energy 0 0 0 0 0  Change in appetite 0 0 0 0 0  Feeling bad or failure about yourself  0 0 0 0 0  Trouble concentrating 0 0 0 0 0  Moving slowly or fidgety/restless 0 0 0 0 0  Suicidal thoughts 0 0 0 0 0  PHQ-9 Score 0 0 0 0 0  Difficult doing work/chores Not difficult at all Not difficult at all Not difficult at all  Not difficult at all       02/07/2024    1:33 PM 12/11/2023    8:11 AM 09/13/2023    2:43 PM 06/18/2023    4:22 PM  GAD 7 : Generalized Anxiety Score  Nervous, Anxious, on Edge 0 0 0 0  Control/stop worrying 0 0 0 0  Worry too much - different things 0 0 0 0  Trouble relaxing 0 0 0 0  Restless 0 0 0 0  Easily annoyed or irritable 0 0 0 0  Afraid - awful might happen 0 0 0 0  Total GAD 7 Score 0 0 0 0  Anxiety Difficulty Not  difficult at all Not difficult at all Not difficult at all Not difficult at all       Assessment & Plan:  Assessment & Plan   Upper respiratory tract infection, unspecified type Persistent cough and sinus congestion. Recent history of similar symptoms that resolved with prednisone. Currently on steroid nasal spray and cough syrup. No wheezing or rales on exam. Flu test negative. Given persistence of strong cough may have atypical PNA, will send future order of zpack to start monday ahead of trip if needed. Highest suspicion for viral illness and subacute cough that will resolve with time. OTC med options provided in after visit summary. -Continue current medications. -If cough persists by Monday, start Azithromycin (Z-Pak) to cover possible atypical pneumonia. -If new symptoms develop, patient to notify office. -Flu test negative. COVID test results pending.  -     Novel Coronavirus, NAA ([facility]) -     Veritor Flu A/B Waived -     Azithromycin; Take 2 tablets on day 1, then 1 tablet daily on days 2 through 5  Dispense: 6 tablet; Refill: 0  Encounter for behavioral health screening As part of their intake evaluation, the patient was screened for depression, anxiety.  PHQ9 SCORE 0, GAD7 SCORE 0. Screening results negative for tested conditions. CTM.  Follow up plan: Return if symptoms worsen or fail to improve.  Marshel Golubski Howell Pringle, MD

## 2024-02-07 NOTE — Patient Instructions (Addendum)
I'm going to send a zpack if your cough persist on Monday of next week to start in case you have a walking pneumonia or atypical pneumonia. The prescription will be able to be filled on Sunday. You do not need an xray at this time and your vitals were all normal. Please return if you develop new or worsening symptoms.   Ok to go on work trip from my medical perspective.  Most cold symptoms last up to 2 weeks, but cough can sometimes linger up to 4 weeks.  However if your symtpoms get WORSE - like you develop fevers or get more shortness of breath, then call your clinic as you may need to be evaluated.   Aches and Pains Acetaminophen (Tylenol): 1000mg  ("extra strength" tablets are 500mg , so take 2) every 8 hours if needed  Ibuprofen (Advil/Motrin) 400-800mg  (comes in 200mg  pills OTC, so 2-4 pills) every 8 hours. - Avoid in excess if cardiac blood pressure issues  Sore Throat:  See Aches and Pains meds above, also Sore throat sprays and lozenges may also help.   Cough:  Honey 2 TBS every 4-6 hours if needed.  Robitussin DM syrup or generic equivalent which has (guaifenesin = an expectorant to help you get stuff up + dextromethorphan (DM) = cough supressant). You can also get this in tablet formula (like Mucinex DM or generic equivalent).  If you have asthma or are wheezing and have a tight chest, then albuterol inhaler (Ventolin, ProAir) may be helpful - you need a prescription for this.   Congestion:  oxymetazoline (Afrin) nasal stray: 2 sprays each nostril every 12 hours. Don't use more than 3 days in a row to avoid building a tolerance to it.  Sinus rinse (neti pot) high volume sinus rinse can help open up your sinuses and be helpful, especially if you're having sinus pressure and headaches.   Other:  Umcka (pelargonium sidoides extract) can to shorten cold symptoms (can be hard to find, but [employer] carries it: brand name Umcka ColdCare from [employer]). Works best if you start taking  at earliest signs of cold symptoms.  Andrographis paniculata is another herbal remedy with less evidence, but may reduce common cold symptoms in adults.  zinc acetate lozenges >= 80 mg/day reduces duration but not severity of cold symptoms in adults, but it is associated with bad taste and nausea Heated humidified air may reduce cold symptoms, so try using a humidifier - especially in your bedroom at night.  Stay hydrated! Aim to drink at least 2 liters of water daily.   What doesn't work (but lots of folks think might) Vitamin C: bummer right?! But there's no evidence that high dose vitamin C will help cold symptoms.

## 2024-02-11 LAB — NOVEL CORONAVIRUS, NAA: SARS-CoV-2, NAA: NOT DETECTED

## 2024-03-13 ENCOUNTER — Encounter: Payer: Self-pay | Admitting: Family Medicine

## 2024-03-13 ENCOUNTER — Ambulatory Visit: Payer: 59 | Admitting: Family Medicine

## 2024-03-13 VITALS — BP 138/78 | HR 98 | Temp 98.2°F | Resp 18 | Ht 75.98 in | Wt 270.0 lb

## 2024-03-13 DIAGNOSIS — E1169 Type 2 diabetes mellitus with other specified complication: Secondary | ICD-10-CM

## 2024-03-13 DIAGNOSIS — R251 Tremor, unspecified: Secondary | ICD-10-CM

## 2024-03-13 NOTE — Assessment & Plan Note (Signed)
 Need to send out A1c. Await results. Treat as needed.

## 2024-03-13 NOTE — Progress Notes (Signed)
 BP 138/78 (BP Location: Left Arm, Patient Position: Sitting, Cuff Size: Large)   Pulse 98   Temp 98.2 F (36.8 C) (Oral)   Resp 18   Ht 6' 3.98" (1.93 m)   Wt 270 lb (122.5 kg)   SpO2 94%   BMI 32.88 kg/m    Subjective:    Patient ID: Ethan Perez, male    DOB: 1971/09/05, 53 y.o.   MRN: [id number]  HPI: Ethan Perez is a 53 y.o. male  Chief Complaint  Patient presents with   Diabetes   DIABETES Hypoglycemic episodes:yes- into the 60s, if he drinks a mountain dew then he will go low a couple of hours after Polydipsia/polyuria: no Visual disturbance: no Chest pain: no Paresthesias: no Glucose Monitoring: yes  Accucheck frequency:  occasionally Taking Insulin?: no Blood Pressure Monitoring: rarely Retinal Examination: Not up to Date Foot Exam: Up to Date Diabetic Education: Completed Pneumovax: Up to Date Influenza: Not up to Date Aspirin: no  Has had a R hand tremor for an extended period of time. Would like to get it checked out.   Relevant past medical, surgical, family and social history reviewed and updated as indicated. Interim medical history since our last visit reviewed. Allergies and medications reviewed and updated.  Review of Systems  Constitutional: Negative.   Respiratory: Negative.    Cardiovascular: Negative.   Gastrointestinal: Negative.   Musculoskeletal: Negative.   Psychiatric/Behavioral: Negative.      Per HPI unless specifically indicated above     Objective:    BP 138/78 (BP Location: Left Arm, Patient Position: Sitting, Cuff Size: Large)   Pulse 98   Temp 98.2 F (36.8 C) (Oral)   Resp 18   Ht 6' 3.98" (1.93 m)   Wt 270 lb (122.5 kg)   SpO2 94%   BMI 32.88 kg/m   Wt Readings from Last 3 Encounters:  03/13/24 270 lb (122.5 kg)  02/07/24 276 lb (125.2 kg)  12/11/23 (!) 302 lb 12.8 oz (137.3 kg)    Physical Exam Vitals and nursing note reviewed.  Constitutional:      General: He is not in acute distress.     Appearance: Normal appearance. He is not ill-appearing, toxic-appearing or diaphoretic.  HENT:     Head: Normocephalic and atraumatic.     Right Ear: External ear normal.     Left Ear: External ear normal.     Nose: Nose normal.     Mouth/Throat:     Mouth: Mucous membranes are moist.     Pharynx: Oropharynx is clear.  Eyes:     General: No scleral icterus.       Right eye: No discharge.        Left eye: No discharge.     Extraocular Movements: Extraocular movements intact.     Conjunctiva/sclera: Conjunctivae normal.     Pupils: Pupils are equal, round, and reactive to light.  Cardiovascular:     Rate and Rhythm: Normal rate and regular rhythm.     Pulses: Normal pulses.     Heart sounds: Normal heart sounds. No murmur heard.    No friction rub. No gallop.  Pulmonary:     Effort: Pulmonary effort is normal. No respiratory distress.     Breath sounds: Normal breath sounds. No stridor. No wheezing, rhonchi or rales.  Chest:     Chest wall: No tenderness.  Musculoskeletal:        General: Normal range of motion.  Cervical back: Normal range of motion and neck supple.  Skin:    General: Skin is warm and dry.     Capillary Refill: Capillary refill takes less than 2 seconds.     Coloration: Skin is not jaundiced or pale.     Findings: No bruising, erythema, lesion or rash.  Neurological:     General: No focal deficit present.     Mental Status: He is alert and oriented to person, place, and time. Mental status is at baseline.  Psychiatric:        Mood and Affect: Mood normal.        Behavior: Behavior normal.        Thought Content: Thought content normal.        Judgment: Judgment normal.     Results for orders placed or performed in visit on 03/13/24  Hgb A1c w/o eAG   Collection Time: 03/13/24  4:36 PM  Result Value Ref Range   Hgb A1c MFr Bld 6.6 (H) 4.8 - 5.6 %      Assessment & Plan:   Problem List Items Addressed This Visit       Endocrine   Type 2  diabetes mellitus with other specified complication (HCC) - Primary   Need to send out A1c. Await results. Treat as needed.       Relevant Orders   Hgb A1c w/o eAG (Completed)   Other Visit Diagnoses       Tremor of right hand       Mild foraminal impingement 2 years ago on c-spine x-ray. He declines PT. Will get him into neurology for evaluation. Await their input.   Relevant Orders   Ambulatory referral to Neurology        Follow up plan: Return in about 3 months (around 06/13/2024) for physical.

## 2024-03-14 LAB — HGB A1C W/O EAG: Hgb A1c MFr Bld: 6.6 % — ABNORMAL HIGH (ref 4.8–5.6)

## 2024-03-16 ENCOUNTER — Encounter: Payer: Self-pay | Admitting: Family Medicine

## 2024-03-25 ENCOUNTER — Encounter: Payer: Self-pay | Admitting: Dermatology

## 2024-03-25 ENCOUNTER — Ambulatory Visit: Payer: 59 | Admitting: Dermatology

## 2024-03-25 DIAGNOSIS — L739 Follicular disorder, unspecified: Secondary | ICD-10-CM | POA: Diagnosis not present

## 2024-03-25 DIAGNOSIS — L719 Rosacea, unspecified: Secondary | ICD-10-CM

## 2024-03-25 DIAGNOSIS — L578 Other skin changes due to chronic exposure to nonionizing radiation: Secondary | ICD-10-CM

## 2024-03-25 DIAGNOSIS — Z85820 Personal history of malignant melanoma of skin: Secondary | ICD-10-CM

## 2024-03-25 DIAGNOSIS — Z1283 Encounter for screening for malignant neoplasm of skin: Secondary | ICD-10-CM | POA: Diagnosis not present

## 2024-03-25 DIAGNOSIS — L905 Scar conditions and fibrosis of skin: Secondary | ICD-10-CM | POA: Diagnosis not present

## 2024-03-25 DIAGNOSIS — L814 Other melanin hyperpigmentation: Secondary | ICD-10-CM

## 2024-03-25 DIAGNOSIS — D1801 Hemangioma of skin and subcutaneous tissue: Secondary | ICD-10-CM

## 2024-03-25 DIAGNOSIS — L821 Other seborrheic keratosis: Secondary | ICD-10-CM

## 2024-03-25 DIAGNOSIS — D229 Melanocytic nevi, unspecified: Secondary | ICD-10-CM

## 2024-03-25 DIAGNOSIS — W908XXA Exposure to other nonionizing radiation, initial encounter: Secondary | ICD-10-CM

## 2024-03-25 DIAGNOSIS — Z7189 Other specified counseling: Secondary | ICD-10-CM

## 2024-03-25 DIAGNOSIS — L918 Other hypertrophic disorders of the skin: Secondary | ICD-10-CM

## 2024-03-25 DIAGNOSIS — Z86006 Personal history of melanoma in-situ: Secondary | ICD-10-CM

## 2024-03-25 DIAGNOSIS — Z86018 Personal history of other benign neoplasm: Secondary | ICD-10-CM

## 2024-03-25 DIAGNOSIS — Z79899 Other long term (current) drug therapy: Secondary | ICD-10-CM

## 2024-03-25 MED ORDER — DOXYCYCLINE HYCLATE 50 MG PO CAPS: ORAL_CAPSULE | ORAL | 4 refills | Status: DC

## 2024-03-25 NOTE — Patient Instructions (Addendum)
 Recommend [facility] Sulfur Soap found on [employer] to use daily at scalp for bumps  Continue Doxycycline 50 mg  take 2 capsules by mouth daily with food and drink for 2 weeks when flared then decrease to 1 pill by mouth daily with food and drink  Doxycycline should be taken with food to prevent nausea. Do not lay down for 30 minutes after taking. Be cautious with sun exposure and use good sun protection while on this medication. Pregnant women should not take this medication.     Melanoma ABCDEs  Melanoma is the most dangerous type of skin cancer, and is the leading cause of death from skin disease.  You are more likely to develop melanoma if you: Have light-colored skin, light-colored eyes, or red or blond hair Spend a lot of time in the sun Tan regularly, either outdoors or in a tanning bed Have had blistering sunburns, especially during childhood Have a close family member who has had a melanoma Have atypical moles or large birthmarks  Early detection of melanoma is key since treatment is typically straightforward and cure rates are extremely high if we catch it early.   The first sign of melanoma is often a change in a mole or a new dark spot.  The ABCDE system is a way of remembering the signs of melanoma.  A for asymmetry:  The two halves do not match. B for border:  The edges of the growth are irregular. C for color:  A mixture of colors are present instead of an even brown color. D for diameter:  Melanomas are usually (but not always) greater than 6mm - the size of a pencil eraser. E for evolution:  The spot keeps changing in size, shape, and color.  Please check your skin once per month between visits. You can use a small mirror in front and a large mirror behind you to keep an eye on the back side or your body.   If you see any new or changing lesions before your next follow-up, please call to schedule a visit.  Please continue daily skin protection including broad  spectrum sunscreen SPF 30+ to sun-exposed areas, reapplying every 2 hours as needed when you're outdoors.   Staying in the shade or wearing long sleeves, sun glasses (UVA+UVB protection) and wide brim hats (4-inch brim around the entire circumference of the hat) are also recommended for sun protection.    Due to recent changes in healthcare laws, you may see results of your pathology and/or laboratory studies on MyChart before the doctors have had a chance to review them. We understand that in some cases there may be results that are confusing or concerning to you. Please understand that not all results are received at the same time and often the doctors may need to interpret multiple results in order to provide you with the best plan of care or course of treatment. Therefore, we ask that you please give [place] 2 business days to thoroughly review all your results before contacting the office for clarification. Should we see a critical lab result, you will be contacted sooner.   If You Need Anything After Your Visit  If you have any questions or concerns for your doctor, please call our main line at [phone] and press option 4 to reach your doctor's medical assistant. If no one answers, please leave a voicemail as directed and we will return your call as soon as possible. Messages left after 4 pm will be answered  the following business day.   You may also send [place] a message via MyChart. We typically respond to MyChart messages within 1-2 business days.  For prescription refills, please ask your pharmacy to contact our office. Our fax number is [phone].  If you have an urgent issue when the clinic is closed that cannot wait until the next business day, you can page your doctor at the number below.    Please note that while we do our best to be available for urgent issues outside of office hours, we are not available 24/7.   If you have an urgent issue and are unable to reach [place], you may choose  to seek medical care at your doctor's office, retail clinic, urgent care center, or emergency room.  If you have a medical emergency, please immediately call 911 or go to the emergency department.  Pager Numbers  - Dr. Gwen Pounds: [phone]  - Dr. Roseanne Reno: [phone]  - Dr. Katrinka Blazing: [phone]   In the event of inclement weather, please call our main line at [phone] for an update on the status of any delays or closures.  Dermatology Medication Tips: Please keep the boxes that topical medications come in in order to help keep track of the instructions about where and how to use these. Pharmacies typically print the medication instructions only on the boxes and not directly on the medication tubes.   If your medication is too expensive, please contact our office at [phone] option 4 or send [place] a message through MyChart.   We are unable to tell what your co-pay for medications will be in advance as this is different depending on your insurance coverage. However, we may be able to find a substitute medication at lower cost or fill out paperwork to get insurance to cover a needed medication.   If a prior authorization is required to get your medication covered by your insurance company, please allow [place] 1-2 business days to complete this process.  Drug prices often vary depending on where the prescription is filled and some pharmacies may offer cheaper prices.  The website www.goodrx.com contains coupons for medications through different pharmacies. The prices here do not account for what the cost may be with help from insurance (it may be cheaper with your insurance), but the website can give you the price if you did not use any insurance.  - You can print the associated coupon and take it with your prescription to the pharmacy.  - You may also stop by our office during regular business hours and pick up a GoodRx coupon card.  - If you need your prescription sent electronically to  a different pharmacy, notify our office through [facility] or by phone at [phone] option 4.     Si Usted Necesita Algo Despus de Su Visita  Tambin puede enviarnos un mensaje a travs de Clinical cytogeneticist. Por lo general respondemos a los mensajes de MyChart en el transcurso de 1 a 2 das hbiles.  Para renovar recetas, por favor pida a su farmacia que se ponga en contacto con nuestra oficina. Annie Sable de fax es [place] [phone].  Si tiene un asunto urgente cuando la clnica est cerrada y que no puede esperar hasta el siguiente da hbil, puede llamar/localizar a su doctor(a) al nmero que aparece a continuacin.   Por favor, tenga en cuenta que aunque hacemos todo lo posible para estar disponibles para asuntos urgentes fuera del horario de oficina, no estamos disponibles las 24 horas del da,  los 7 das de la [place].   Si tiene un problema urgente y no puede comunicarse con nosotros, puede optar por buscar atencin mdica  en el consultorio de su doctor(a), en una clnica privada, en un centro de atencin urgente o en una sala de emergencias.  Si tiene Engineer, drilling, por favor llame inmediatamente al 911 o vaya a la sala de emergencias.  Nmeros de bper  - Dr. Gwen Pounds: [phone]  - Dra. Roseanne Reno: [phone]  - Dr. Katrinka Blazing: [phone]   En caso de inclemencias del tiempo, por favor llame a Lacy Duverney principal al [phone] para una actualizacin sobre el [place] de cualquier retraso o cierre.  Consejos para la medicacin en dermatologa: Por favor, guarde las cajas en las que vienen los medicamentos de uso tpico para ayudarle a seguir las instrucciones sobre dnde y cmo usarlos. Las farmacias generalmente imprimen las instrucciones del medicamento slo en las cajas y no directamente en los tubos del [place].   Si su medicamento es muy caro, por favor, pngase en contacto con Rolm Gala llamando al [phone] y presione la opcin 4 o envenos  un mensaje a travs de Clinical cytogeneticist.   No podemos decirle cul ser su copago por los medicamentos por adelantado ya que esto es diferente dependiendo de la cobertura de su seguro. Sin embargo, es posible que podamos encontrar un medicamento sustituto a Audiological scientist un formulario para que el seguro cubra el medicamento que se considera necesario.   Si se requiere una autorizacin previa para que su compaa de seguros [place] su medicamento, por favor permtanos de 1 a 2 das hbiles para completar [address]  Los precios de los medicamentos varan con frecuencia dependiendo del Environmental consultant de dnde se surte la receta y alguna farmacias pueden ofrecer precios ms baratos.  El sitio web www.goodrx.com tiene cupones para medicamentos de Health and safety inspector. Los precios aqu no tienen en cuenta lo que podra costar con la ayuda del seguro (puede ser ms barato con su seguro), pero el sitio web puede darle el precio si no utiliz Tourist information centre manager.  - Puede imprimir el cupn correspondiente y llevarlo con su receta a la farmacia.  - Tambin puede pasar por nuestra oficina durante el horario de atencin regular y Education officer, museum una tarjeta de cupones de GoodRx.  - Si necesita que su receta se enve electrnicamente a una farmacia diferente, informe a nuestra oficina a travs de MyChart de [place] o por telfono llamando al [phone] y presione la opcin 4.

## 2024-03-25 NOTE — Progress Notes (Signed)
 Follow-Up Visit   Subjective  Ethan Perez is a 53 y.o. male who presents for the following: Skin Cancer Screening and Full Body Skin Exam Hx of melanoma in situ, hx of dysplastic nevus,  Reports some bumps at scalp would like to discuss refills of treatment.   The patient presents for Total-Body Skin Exam (TBSE) for skin cancer screening and mole check. The patient has spots, moles and lesions to be evaluated, some may be new or changing and the patient may have concern these could be cancer.  The following portions of the chart were reviewed this encounter and updated as appropriate: medications, allergies, medical history  Review of Systems:  No other skin or systemic complaints except as noted in HPI or Assessment and Plan.  Objective  Well appearing patient in no apparent distress; mood and affect are within normal limits.  A full examination was performed including scalp, head, eyes, ears, nose, lips, neck, chest, axillae, abdomen, back, buttocks, bilateral upper extremities, bilateral lower extremities, hands, feet, fingers, toes, fingernails, and toenails. All findings within normal limits unless otherwise noted below.   Relevant physical exam findings are noted in the Assessment and Plan.   Assessment & Plan   SKIN CANCER SCREENING PERFORMED TODAY.  ACTINIC DAMAGE - Chronic condition, secondary to cumulative UV/sun exposure - diffuse scaly erythematous macules with underlying dyspigmentation - Recommend daily broad spectrum sunscreen SPF 30+ to sun-exposed areas, reapply every 2 hours as needed.  - Staying in the shade or wearing long sleeves, sun glasses (UVA+UVB protection) and wide brim hats (4-inch brim around the entire circumference of the hat) are also recommended for sun protection.  - Call for new or changing lesions.  LENTIGINES, SEBORRHEIC KERATOSES, HEMANGIOMAS - Benign normal skin lesions - Benign-appearing - Call for any changes  MELANOCYTIC NEVI -  Tan-brown and/or pink-flesh-colored symmetric macules and papules - Benign appearing on exam today - Observation - Call clinic for new or changing moles - Recommend daily use of broad spectrum spf 30+ sunscreen to sun-exposed areas.   Acrochordons (Skin Tags) Axillary area  - Fleshy, skin-colored pedunculated papules - Benign appearing.  - Observe. - If desired, they can be removed with an in office procedure that is not covered by insurance. - Please call the clinic if you notice any new or changing lesions.  Rosacea and folliculitis of scalp- Chronic with Scarring. Bacterial vs Sterile vs Pityrosporum vs combination. post scalp Exam :  scarring and alopecia Plan: Continue Doxycycline 50mg  2 po qd with food and drink for 2 weeks then decrease to 1 po qd with food and drink Patient no longer using Ketoconazole 2 % shampoo.  He does not want to continue it. Recommend JOESOEF SKIN CARE Sulfur Soap found on [employer] to use daily at scalp for bumps Doxycycline should be taken with food to prevent nausea. Do not lay down for 30 minutes after taking. Be cautious with sun exposure and use good sun protection while on this medication. Pregnant women should not take this medication.  Long term medication management.  Patient is using long term (months to years) prescription medication  to control their dermatologic condition.  These medications require periodic monitoring to evaluate for efficacy and side effects and may require periodic laboratory monitoring.   HISTORY OF MELANOMA IN SITU. Right mid back paraspinal superior medial. Arising in dysplastic nevus. 04/14/2019. - No evidence of recurrence today - No lymphadenopathy - Recommend regular full body skin exams - Recommend daily broad spectrum sunscreen  SPF 30+ to sun-exposed areas, reapply every 2 hours as needed.  - Call if any new or changing lesions are noted between office visits    HISTORY OF DYSPLASTIC NEVUS. Right mid back  paraspinal inferior, Excised 07/21/2019, right mid back paraspinal superior lateral excised 04/14/2019, left of midline suprapubic inf to waistline  No evidence of recurrence today Recommend regular full body skin exams Recommend daily broad spectrum sunscreen SPF 30+ to sun-exposed areas, reapply every 2 hours as needed.  Call if any new or changing lesions are noted between office visits    FOLLICULITIS   Related Medications doxycycline (VIBRAMYCIN) 50 MG capsule TAKE 2 CAPSULES( 100 MG TOTAL) BY MOUTH DAILY WITH FOOD AND DRINK Return in about 1 year (around 03/25/2025) for TBSE.  IAsher Muir, CMA, am acting as scribe for Armida Sans, MD.   Documentation: I have reviewed the above documentation for accuracy and completeness, and I agree with the above.  Armida Sans, MD

## 2024-04-15 ENCOUNTER — Encounter: Payer: Self-pay | Admitting: Family Medicine

## 2024-04-17 ENCOUNTER — Other Ambulatory Visit: Payer: Self-pay | Admitting: Family Medicine

## 2024-04-17 NOTE — Telephone Encounter (Signed)
 Copied from CRM [id number]. Topic: Clinical - Medication Refill >> Apr 17, 2024  4:34 PM Turkey B wrote: Most Recent Primary Care Visit:  Provider: Terre Ferri P  Department: [facility]  Visit Type: OFFICE VISIT  Date: 03/13/2024  Medication: Semaglutide  (RYBELSUS ) 14 MG TABS  Has the patient contacted their pharmacy? yes (Agent: If yes, when and what did the pharmacy advise?)  Is this the correct pharmacy for this prescription? yes  This is the patient's preferred pharmacy:  [id number] - [place], [place] - [address] AT [facility] OF [address] [facility] [address] [place] [address] Phone: [phone] Fax: [phone]    Has the prescription been filled recently? no  Is the patient out of the medication? yes  Has the patient been seen for an appointment in the last year OR does the patient have an upcoming appointment? yes  Can we respond through MyChart? yes  Agent: Please be advised that Rx refills may take up to 3 business days. We ask that you follow-up with your pharmacy.

## 2024-04-17 NOTE — Telephone Encounter (Signed)
 Need to call pharmacy. Refilled 12/11/2023 #[phone] rf

## 2024-04-20 NOTE — Telephone Encounter (Signed)
 Call to pharmacy- patient picked up #30 (on Friday) at alternated [facility] location- still has #60 on file.  Requested Prescriptions  Pending Prescriptions Disp Refills   Semaglutide  (RYBELSUS ) 14 MG TABS 90 tablet 1    Sig: Take 1 tablet (14 mg total) by mouth daily.     Off-Protocol Failed - 04/20/2024  8:58 AM      Failed - Medication not assigned to a protocol, review manually.      Passed - Valid encounter within last 12 months    Recent Outpatient Visits           1 month ago Type 2 diabetes mellitus with other specified complication, without long-term current use of insulin (HCC)   [facility] [place], Megan P, DO   2 months ago Upper respiratory tract infection, unspecified type   [facility] Hadassah Letters, MD       Future Appointments             In 11 months Elta Halter, MD [facility]

## 2024-06-15 ENCOUNTER — Ambulatory Visit: Admitting: Family Medicine

## 2024-06-15 ENCOUNTER — Encounter: Payer: Self-pay | Admitting: Family Medicine

## 2024-06-15 VITALS — BP 134/78 | HR 80 | Ht 75.0 in | Wt 283.0 lb

## 2024-06-15 DIAGNOSIS — N3943 Post-void dribbling: Secondary | ICD-10-CM | POA: Diagnosis not present

## 2024-06-15 DIAGNOSIS — N401 Enlarged prostate with lower urinary tract symptoms: Secondary | ICD-10-CM | POA: Diagnosis not present

## 2024-06-15 DIAGNOSIS — E1169 Type 2 diabetes mellitus with other specified complication: Secondary | ICD-10-CM | POA: Diagnosis not present

## 2024-06-15 DIAGNOSIS — Z23 Encounter for immunization: Secondary | ICD-10-CM | POA: Diagnosis not present

## 2024-06-15 DIAGNOSIS — Z0000 Encounter for general adult medical examination without abnormal findings: Secondary | ICD-10-CM | POA: Diagnosis not present

## 2024-06-15 DIAGNOSIS — E7849 Other hyperlipidemia: Secondary | ICD-10-CM

## 2024-06-15 MED ORDER — TADALAFIL 5 MG PO TABS: 5.0000 mg | ORAL_TABLET | Freq: Every day | ORAL | 1 refills | Status: DC

## 2024-06-15 MED ORDER — ALBUTEROL SULFATE (2.5 MG/3ML) 0.083% IN NEBU: 2.5000 mg | INHALATION_SOLUTION | Freq: Four times a day (QID) | RESPIRATORY_TRACT | 1 refills | Status: DC | PRN

## 2024-06-15 MED ORDER — BACLOFEN 10 MG PO TABS: 10.0000 mg | ORAL_TABLET | Freq: Every day | ORAL | 1 refills | Status: DC | PRN

## 2024-06-15 MED ORDER — LISINOPRIL 5 MG PO TABS
5.0000 mg | ORAL_TABLET | Freq: Every day | ORAL | 0 refills | Status: DC
Start: 2024-06-15 — End: 2024-08-14

## 2024-06-15 MED ORDER — RYBELSUS 14 MG PO TABS: 14.0000 mg | ORAL_TABLET | Freq: Every day | ORAL | 1 refills | Status: DC

## 2024-06-15 NOTE — Assessment & Plan Note (Signed)
 Stable. Following with urology. Call with any concerns.

## 2024-06-15 NOTE — Assessment & Plan Note (Signed)
 Rechecking labs today. Await results. Treat as needed.

## 2024-06-15 NOTE — Assessment & Plan Note (Signed)
 A1c up slightly at 7.0. Encouraged patient to take his medicine regularly. Call with any concerns. Recheck 3 months. Call with any concerns.

## 2024-06-15 NOTE — Progress Notes (Signed)
 BP 134/78   Pulse 80   Ht 6' 3 (1.905 m)   Wt 283 lb (128.4 kg)   SpO2 95%   BMI 35.37 kg/m    Subjective:    Patient ID: Ethan Perez, male    DOB: April 19, 1971, 53 y.o.   MRN: [id number]  HPI: Ethan Perez is a 53 y.o. male presenting on 06/15/2024 for comprehensive medical examination. Current medical complaints include:  DIABETES Hypoglycemic episodes:no Polydipsia/polyuria: no Visual disturbance: no Chest pain: no Paresthesias: no Glucose Monitoring: no  Accucheck frequency: occasionally Taking Insulin?: no Blood Pressure Monitoring: rarely Retinal Examination: Not up to Date Foot Exam: Not up to Date Diabetic Education: Completed Pneumovax: Not up to Date Influenza: Up to Date Aspirin: no  HYPERTENSION / HYPERLIPIDEMIA Satisfied with current treatment? yes Duration of hypertension: chronic BP monitoring frequency: not checking BP medication side effects: no Past BP meds: lisinopril  Duration of hyperlipidemia: chronic Cholesterol medication side effects: N/A Cholesterol supplements: none Past cholesterol medications: none Medication compliance: N/A Aspirin: no Recent stressors: no Recurrent headaches: no Visual changes: no Palpitations: no Dyspnea: no Chest pain: no Lower extremity edema: no Dizzy/lightheaded: no  He currently lives with: wife and kids Interim Problems from his last visit: no  Depression Screen done today and results listed below:     03/13/2024    5:36 PM 02/07/2024    1:32 PM 12/11/2023    8:11 AM 09/13/2023    2:43 PM 06/18/2023    4:22 PM  Depression screen PHQ 2/9  Decreased Interest 0 0 0 0 0  Down, Depressed, Hopeless 0 0 0 0 0  PHQ - 2 Score 0 0 0 0 0  Altered sleeping 0 0 0 0 0  Tired, decreased energy 0 0 0 0 0  Change in appetite 0 0 0 0 0  Feeling bad or failure about yourself  0 0 0 0 0  Trouble concentrating 0 0 0 0 0  Moving slowly or fidgety/restless 0 0 0 0 0  Suicidal thoughts 0 0 0 0 0  PHQ-9  Score 0 0 0 0 0  Difficult doing work/chores  Not difficult at all Not difficult at all Not difficult at all     Past Medical History:  Past Medical History:  Diagnosis Date   Allergy 1980   Seasonal   Asthma    Cancer (HCC)    sinus cancer 09/2018   Diabetes mellitus without complication (HCC)    Dysplastic nevus 04/07/2019   right mid back paraspinal inferior, Excised 07/21/2019, right mid back paraspinal superior lateral excised 04/14/2019, left of midline suprapubic inf to waistline   History of concussion    Hyperlipidemia    IFG (impaired fasting glucose)    Melanoma (HCC) 04/07/2019   MIS, arising in dysplastic nevus. Right mid back paraspinal, sup. medial. Excised 04/14/2019   Migraines    Numbness and tingling of left thumb    Obesity    Ruptured disc, cervical 2015    Surgical History:  Past Surgical History:  Procedure Laterality Date   EYE SURGERY     KNEE SURGERY Left 2007   Arthoscopic   LAMINECTOMY  01/2015   MELANOMA EXCISION Right 04/14/2019   right mid back paraspinal sup med - [facility] Free   NASAL SEPTUM SURGERY  2001   sinus cancer removal  10/14/2018   SPINE SURGERY     TONSILLECTOMY  1976    Medications:  Current Outpatient Medications on File Prior to Visit  Medication Sig   acetic acid -hydrocortisone  (VOSOL -HC) OTIC solution Place 3 drops into both ears 2 (two) times daily.   albuterol  (PROVENTIL  HFA;VENTOLIN  HFA) 108 (90 Base) MCG/ACT inhaler Inhale 1-2 puffs into the lungs every 6 (six) hours as needed for wheezing or shortness of breath.   aspirin-acetaminophen-caffeine  (EXCEDRIN MIGRAINE) 250-250-65 MG tablet Take 2 tablets by mouth every 6 (six) hours as needed for headache.   Blood Glucose Monitoring Suppl (ONE TOUCH ULTRA 2) w/Device KIT SMARTSIG:1 Each Via Meter As Directed   doxycycline  (VIBRAMYCIN ) 50 MG capsule TAKE 2 CAPSULES( 100 MG TOTAL) BY MOUTH DAILY WITH FOOD AND DRINK (Patient taking differently: PRN  TAKE 2 CAPSULES( 100 MG  TOTAL) BY MOUTH DAILY WITH FOOD AND DRINK)   glucose blood (ONETOUCH ULTRA) test strip Use to check blood sugar twice a day.   hydrocortisone  2.5 % cream Apply topically daily. Monday, Wednesday, Friday   mirabegron ER (MYRBETRIQ) 50 MG TB24 tablet Take 50 mg by mouth daily.   naproxen  (NAPROSYN ) 500 MG tablet Take 1 tablet (500 mg total) by mouth 2 (two) times daily with a meal.   Vitamin D , Ergocalciferol , (DRISDOL ) 1.25 MG (50000 UNIT) CAPS capsule TAKE 1 CAPSULE BY MOUTH EVERY 7 DAYS   fluticasone (FLONASE) 50 MCG/ACT nasal spray Place into both nostrils daily. (Patient not taking: Reported on 06/15/2024)   No current facility-administered medications on file prior to visit.    Allergies:  Allergies  Allergen Reactions   Cat Dander Shortness Of Breath    Rabbits, [place] pigs   Other Swelling    [place] nuts   Prochlorperazine    Tramadol Other (See Comments)    Sleep for 24 hr.   Compazine [Prochlorperazine Edisylate] Other (See Comments)    Causes involuntary muscle movements.    Social History:  Social History   Socioeconomic History   Marital status: Married    Spouse name: Not on file   Number of children: 3   Years of education: Not on file   Highest education level: Not on file  Occupational History   Not on file  Tobacco Use   Smoking status: Never   Smokeless tobacco: Never  Vaping Use   Vaping status: Never Used  Substance and Sexual Activity   Alcohol use: No   Drug use: No   Sexual activity: Yes    Partners: Female    Birth control/protection: Condom  Other Topics Concern   Not on file  Social History Narrative   Not on file   Social Drivers of Health   Financial Resource Strain: Not on file  Food Insecurity: Not on file  Transportation Needs: Not on file  Physical Activity: Insufficiently Active (06/15/2024)   Exercise Vital Sign    Days of Exercise per Week: 5 days    Minutes of Exercise per Session: 10 min  Stress: No Stress Concern Present  (06/15/2024)   [employer] of Occupational Health - Occupational Stress Questionnaire    Feeling of Stress: Not at all  Social Connections: Socially Integrated (06/15/2024)   Social Connection and Isolation Panel    Frequency of Communication with Friends and Family: More than three times a week    Frequency of Social Gatherings with Friends and Family: Once a week    Attends Religious Services: More than 4 times per year    Active Member of [employer] or Organizations: Yes    Attends Banker Meetings: More than 4 times per year    Marital Status: Married  Intimate Partner Violence: Not At Risk (06/15/2024)   Humiliation, Afraid, Rape, and Kick questionnaire    Fear of Current or Ex-Partner: No    Emotionally Abused: No    Physically Abused: No    Sexually Abused: No   Social History   Tobacco Use  Smoking Status Never  Smokeless Tobacco Never   Social History   Substance and Sexual Activity  Alcohol Use No    Family History:  Family History  Problem Relation Age of Onset   Cirrhosis Mother    Seizures Mother        s/p hit her head   Diabetes Mother    Hypertension Mother    Heart disease Mother    Kidney failure Mother    Kidney disease Mother    Migraines Father        with aura   Heart disease Paternal Grandfather    Lung disease Paternal Grandfather        worked in a Therapist, sports house   Alcohol abuse Paternal Grandfather    Diabetes Brother    Migraines Brother        with aura   Diabetes Maternal Grandmother    Heart disease Maternal Grandmother    Diabetes Maternal Grandfather    Heart disease Maternal Grandfather    Stroke Maternal Grandfather    Heart disease Paternal Grandmother    Cancer Paternal Aunt    Cancer Paternal Aunt    Diabetes Brother     Past medical history, surgical history, medications, allergies, family history and social history reviewed with patient today and changes made to appropriate areas of the chart.   Review of  Systems  Constitutional: Negative.   HENT: Negative.         Little sores in his ears  Eyes: Negative.   Respiratory:  Positive for cough and wheezing. Negative for hemoptysis, sputum production and shortness of breath.   Cardiovascular: Negative.   Gastrointestinal:  Positive for heartburn (with more weight). Negative for abdominal pain, blood in stool, constipation, diarrhea, melena, nausea and vomiting.  Genitourinary: Negative.   Musculoskeletal: Negative.   Skin: Negative.   Neurological: Negative.   Endo/Heme/Allergies:  Positive for environmental allergies. Negative for polydipsia. Does not bruise/bleed easily.  Psychiatric/Behavioral: Negative.     All other ROS negative except what is listed above and in the HPI.      Objective:    BP 134/78   Pulse 80   Ht 6' 3 (1.905 m)   Wt 283 lb (128.4 kg)   SpO2 95%   BMI 35.37 kg/m   Wt Readings from Last 3 Encounters:  06/15/24 283 lb (128.4 kg)  03/13/24 270 lb (122.5 kg)  02/07/24 276 lb (125.2 kg)    Physical Exam Vitals and nursing note reviewed.  Constitutional:      General: He is not in acute distress.    Appearance: Normal appearance. He is obese. He is not ill-appearing, toxic-appearing or diaphoretic.  HENT:     Head: Normocephalic and atraumatic.     Right Ear: Tympanic membrane, ear canal and external ear normal. There is no impacted cerumen.     Left Ear: Tympanic membrane, ear canal and external ear normal. There is no impacted cerumen.     Nose: Nose normal. No congestion or rhinorrhea.     Mouth/Throat:     Mouth: Mucous membranes are moist.     Pharynx: Oropharynx is clear. No oropharyngeal exudate or posterior oropharyngeal erythema.   Eyes:  General: No scleral icterus.       Right eye: No discharge.        Left eye: No discharge.     Extraocular Movements: Extraocular movements intact.     Conjunctiva/sclera: Conjunctivae normal.     Pupils: Pupils are equal, round, and reactive to light.    Neck:     Vascular: No carotid bruit.   Cardiovascular:     Rate and Rhythm: Normal rate and regular rhythm.     Pulses: Normal pulses.     Heart sounds: No murmur heard.    No friction rub. No gallop.  Pulmonary:     Effort: Pulmonary effort is normal. No respiratory distress.     Breath sounds: Normal breath sounds. No stridor. No wheezing, rhonchi or rales.  Chest:     Chest wall: No tenderness.  Abdominal:     General: Abdomen is flat. Bowel sounds are normal. There is no distension.     Palpations: Abdomen is soft. There is no mass.     Tenderness: There is no abdominal tenderness. There is no right CVA tenderness, left CVA tenderness, guarding or rebound.     Hernia: No hernia is present.  Genitourinary:    Comments: Genital exam deferred with shared decision making  Musculoskeletal:        General: No swelling, tenderness, deformity or signs of injury.     Cervical back: Normal range of motion and neck supple. No rigidity. No muscular tenderness.     Right lower leg: No edema.     Left lower leg: No edema.  Lymphadenopathy:     Cervical: No cervical adenopathy.   Skin:    General: Skin is warm and dry.     Capillary Refill: Capillary refill takes less than 2 seconds.     Coloration: Skin is not jaundiced or pale.     Findings: No bruising, erythema, lesion or rash.   Neurological:     General: No focal deficit present.     Mental Status: He is alert and oriented to person, place, and time.     Cranial Nerves: No cranial nerve deficit.     Sensory: No sensory deficit.     Motor: No weakness.     Coordination: Coordination normal.     Gait: Gait normal.     Deep Tendon Reflexes: Reflexes normal.   Psychiatric:        Mood and Affect: Mood normal.        Behavior: Behavior normal.        Thought Content: Thought content normal.        Judgment: Judgment normal.     Results for orders placed or performed in visit on 03/13/24  Hgb A1c w/o eAG   Collection  Time: 03/13/24  4:36 PM  Result Value Ref Range   Hgb A1c MFr Bld 6.6 (H) 4.8 - 5.6 %      Assessment & Plan:   Problem List Items Addressed This Visit       Endocrine   Type 2 diabetes mellitus with other specified complication (HCC)   A1c up slightly at 7.0. Encouraged patient to take his medicine regularly. Call with any concerns. Recheck 3 months. Call with any concerns.       Relevant Medications   Semaglutide  (RYBELSUS ) 14 MG TABS   lisinopril  (ZESTRIL ) 5 MG tablet   Other Relevant Orders   Bayer DCA Hb A1c Waived   Microalbumin, Urine Waived  Genitourinary   Benign prostatic hyperplasia with post-void dribbling   Stable. Following with urology. Call with any concerns.       Relevant Medications   mirabegron ER (MYRBETRIQ) 50 MG TB24 tablet     Other   Hyperlipidemia (Chronic)   Rechecking labs today. Await results. Treat as needed.       Relevant Medications   tadalafil  (CIALIS ) 5 MG tablet   lisinopril  (ZESTRIL ) 5 MG tablet   Other Visit Diagnoses       Routine general medical examination at a health care facility    -  Primary   Vaccines updated. Screening labs checked today. Cologuard at home- will do. Continue diet and exercise. Call with any concerns.   Relevant Orders   Bayer DCA Hb A1c Waived   Comprehensive metabolic panel with GFR   CBC with Differential/Platelet   Lipid Panel w/o Chol/HDL Ratio   PSA   TSH        LABORATORY TESTING:  Health maintenance labs ordered today as discussed above.   The natural history of prostate cancer and ongoing controversy regarding screening and potential treatment outcomes of prostate cancer has been discussed with the patient. The meaning of a false positive PSA and a false negative PSA has been discussed. He indicates understanding of the limitations of this screening test and wishes  to proceed with screening PSA testing.   IMMUNIZATIONS:   - Tdap: Tetanus vaccination status reviewed: last tetanus  booster within 10 years. - Influenza: Postponed to flu season - Pneumovax: Refused - Prevnar: Refused - COVID: Refused - HPV: Not applicable - Shingrix vaccine: Administered today  SCREENING: - Colonoscopy: cologuard ordered  Discussed with patient purpose of the colonoscopy is to detect colon cancer at curable precancerous or early stages    PATIENT COUNSELING:    Sexuality: Discussed sexually transmitted diseases, partner selection, use of condoms, avoidance of unintended pregnancy  and contraceptive alternatives.   Advised to avoid cigarette smoking.  I discussed with the patient that most people either abstain from alcohol or drink within safe limits (<=14/week and <=4 drinks/occasion for males, <=7/weeks and <= 3 drinks/occasion for females) and that the risk for alcohol disorders and other health effects rises proportionally with the number of drinks per week and how often a drinker exceeds daily limits.  Discussed cessation/primary prevention of drug use and availability of treatment for abuse.   Diet: Encouraged to adjust caloric intake to maintain  or achieve ideal body weight, to reduce intake of dietary saturated fat and total fat, to limit sodium intake by avoiding high sodium foods and not adding table salt, and to maintain adequate dietary potassium and calcium preferably from fresh fruits, vegetables, and low-fat dairy products.    stressed the importance of regular exercise  Injury prevention: Discussed safety belts, safety helmets, smoke detector, smoking near bedding or upholstery.   Dental health: Discussed importance of regular tooth brushing, flossing, and dental visits.   Follow up plan: NEXT PREVENTATIVE PHYSICAL DUE IN 1 YEAR. Return in about 3 months (around 09/15/2024).

## 2024-06-16 ENCOUNTER — Ambulatory Visit: Payer: Self-pay | Admitting: Family Medicine

## 2024-06-16 LAB — CBC WITH DIFFERENTIAL/PLATELET
Basophils Absolute: 0.1 10*3/uL (ref 0.0–0.2)
Basos: 1 %
EOS (ABSOLUTE): 0.4 10*3/uL (ref 0.0–0.4)
Eos: 4 %
Hematocrit: 51.4 % — ABNORMAL HIGH (ref 37.5–51.0)
Hemoglobin: 17.5 g/dL (ref 13.0–17.7)
Immature Grans (Abs): 0 10*3/uL (ref 0.0–0.1)
Immature Granulocytes: 0 %
Lymphocytes Absolute: 3.1 10*3/uL (ref 0.7–3.1)
Lymphs: 35 %
MCH: 30.3 pg (ref 26.6–33.0)
MCHC: 34 g/dL (ref 31.5–35.7)
MCV: 89 fL (ref 79–97)
Monocytes Absolute: 0.8 10*3/uL (ref 0.1–0.9)
Monocytes: 9 %
Neutrophils Absolute: 4.5 10*3/uL (ref 1.4–7.0)
Neutrophils: 51 %
Platelets: 192 10*3/uL (ref 150–450)
RBC: 5.77 x10E6/uL (ref 4.14–5.80)
RDW: 13.5 % (ref 11.6–15.4)
WBC: 8.8 10*3/uL (ref 3.4–10.8)

## 2024-06-16 LAB — COMPREHENSIVE METABOLIC PANEL WITH GFR
ALT: 31 IU/L (ref 0–44)
AST: 24 IU/L (ref 0–40)
Albumin: 4 g/dL (ref 3.8–4.9)
Alkaline Phosphatase: 69 IU/L (ref 44–121)
BUN/Creatinine Ratio: 13 (ref 9–20)
BUN: 12 mg/dL (ref 6–24)
Bilirubin Total: 0.5 mg/dL (ref 0.0–1.2)
CO2: 22 mmol/L (ref 20–29)
Calcium: 10.1 mg/dL (ref 8.7–10.2)
Chloride: 99 mmol/L (ref 96–106)
Creatinine, Ser: 0.9 mg/dL (ref 0.76–1.27)
Globulin, Total: 3.1 g/dL (ref 1.5–4.5)
Glucose: 123 mg/dL — ABNORMAL HIGH (ref 70–99)
Potassium: 4.2 mmol/L (ref 3.5–5.2)
Sodium: 137 mmol/L (ref 134–144)
Total Protein: 7.1 g/dL (ref 6.0–8.5)
eGFR: 103 mL/min/{1.73_m2} (ref >59)

## 2024-06-16 LAB — LIPID PANEL W/O CHOL/HDL RATIO
Cholesterol, Total: 216 mg/dL — ABNORMAL HIGH (ref 100–199)
HDL: 35 mg/dL — ABNORMAL LOW (ref >39)
LDL Chol Calc (NIH): 143 mg/dL — ABNORMAL HIGH (ref 0–99)
Triglycerides: 209 mg/dL — ABNORMAL HIGH (ref 0–149)
VLDL Cholesterol Cal: 38 mg/dL (ref 5–40)

## 2024-06-16 LAB — BAYER DCA HB A1C WAIVED: HB A1C (BAYER DCA - WAIVED): 7 % — ABNORMAL HIGH (ref 4.8–5.6)

## 2024-06-16 LAB — MICROALBUMIN, URINE WAIVED
Creatinine, Urine Waived: 100 mg/dL (ref 10–300)
Microalb, Ur Waived: 30 mg/L — ABNORMAL HIGH (ref 0–19)
Microalb/Creat Ratio: <30 mg/g (ref <30)

## 2024-06-16 LAB — TSH: TSH: 2.05 u[IU]/mL (ref 0.450–4.500)

## 2024-06-16 LAB — PSA: Prostate Specific Ag, Serum: 0.6 ng/mL (ref 0.0–4.0)

## 2024-08-14 ENCOUNTER — Other Ambulatory Visit: Payer: Self-pay | Admitting: Family Medicine

## 2024-08-14 NOTE — Telephone Encounter (Signed)
 Requested Prescriptions  Pending Prescriptions Disp Refills   lisinopril  (ZESTRIL ) 5 MG tablet [Pharmacy Med Name: LISINOPRIL  5MG  TABLETS] 90 tablet 1    Sig: TAKE 1 TABLET(5 MG) BY MOUTH DAILY     Cardiovascular:  ACE Inhibitors Passed - 08/14/2024  5:34 PM      Passed - Cr in normal range and within 180 days    Creat  Date Value Ref Range Status  01/23/2019 0.86 0.60 - 1.35 mg/dL Final   Creatinine, Ser  Date Value Ref Range Status  06/15/2024 0.90 0.76 - 1.27 mg/dL Final         Passed - K in normal range and within 180 days    Potassium  Date Value Ref Range Status  06/15/2024 4.2 3.5 - 5.2 mmol/L Final  07/09/2013 3.7 3.5 - 5.1 mmol/L Final         Passed - Patient is not pregnant      Passed - Last BP in normal range    BP Readings from Last 1 Encounters:  06/15/24 134/78         Passed - Valid encounter within last 6 months    Recent Outpatient Visits           2 months ago Routine general medical examination at a health care facility   [facility], Megan P, DO   5 months ago Type 2 diabetes mellitus with other specified complication, without long-term current use of insulin (HCC)   [place] [facility] [place], Megan P, DO   6 months ago Upper respiratory tract infection, unspecified type   [place] Hadassah SQUIBB, MD       Future Appointments             In 7 months Hester Alm BROCKS, MD [facility]

## 2024-09-10 ENCOUNTER — Encounter: Payer: Self-pay | Admitting: Family Medicine

## 2024-09-10 ENCOUNTER — Ambulatory Visit: Admitting: Family Medicine

## 2024-09-10 VITALS — BP 138/90 | HR 80 | Ht 75.0 in | Wt 281.6 lb

## 2024-09-10 DIAGNOSIS — E1169 Type 2 diabetes mellitus with other specified complication: Secondary | ICD-10-CM

## 2024-09-10 NOTE — Progress Notes (Signed)
 BP (!) 138/90 (BP Location: Left Arm, Patient Position: Sitting, Cuff Size: Large)   Pulse 80   Ht 6' 3 (1.905 m)   Wt 281 lb 9.6 oz (127.7 kg)   SpO2 95%   BMI 35.20 kg/m    Subjective:    Patient ID: Ethan Perez, male    DOB: 12-25-1970, 53 y.o.   MRN: [id number]  HPI: Ethan Perez is a 53 y.o. male  Chief Complaint  Patient presents with   Diabetes   DIABETES Hypoglycemic episodes:a couple of times has dropped to 60 Polydipsia/polyuria: no Visual disturbance: no Chest pain: no Paresthesias: no Glucose Monitoring: no  Accucheck frequency: Not Checking Taking Insulin?: no Blood Pressure Monitoring: not checking Retinal Examination: Not up to Date Foot Exam: Not up to Date Diabetic Education: Completed Pneumovax: Not up to Date Influenza: Not up to Date Aspirin: yes   Relevant past medical, surgical, family and social history reviewed and updated as indicated. Interim medical history since our last visit reviewed. Allergies and medications reviewed and updated.  Review of Systems  Constitutional: Negative.   Respiratory: Negative.    Cardiovascular: Negative.   Musculoskeletal: Negative.   Neurological: Negative.   Psychiatric/Behavioral: Negative.      Per HPI unless specifically indicated above     Objective:    BP (!) 138/90 (BP Location: Left Arm, Patient Position: Sitting, Cuff Size: Large)   Pulse 80   Ht 6' 3 (1.905 m)   Wt 281 lb 9.6 oz (127.7 kg)   SpO2 95%   BMI 35.20 kg/m   Wt Readings from Last 3 Encounters:  09/10/24 281 lb 9.6 oz (127.7 kg)  06/15/24 283 lb (128.4 kg)  03/13/24 270 lb (122.5 kg)    Physical Exam Vitals and nursing note reviewed.  Constitutional:      General: He is not in acute distress.    Appearance: Normal appearance. He is not ill-appearing, toxic-appearing or diaphoretic.  HENT:     Head: Normocephalic and atraumatic.     Right Ear: External ear normal.     Left Ear: External ear normal.      Nose: Nose normal.     Mouth/Throat:     Mouth: Mucous membranes are moist.     Pharynx: Oropharynx is clear.  Eyes:     General: No scleral icterus.       Right eye: No discharge.        Left eye: No discharge.     Extraocular Movements: Extraocular movements intact.     Conjunctiva/sclera: Conjunctivae normal.     Pupils: Pupils are equal, round, and reactive to light.  Cardiovascular:     Rate and Rhythm: Normal rate and regular rhythm.     Pulses: Normal pulses.     Heart sounds: Normal heart sounds. No murmur heard.    No friction rub. No gallop.  Pulmonary:     Effort: Pulmonary effort is normal. No respiratory distress.     Breath sounds: Normal breath sounds. No stridor. No wheezing, rhonchi or rales.  Chest:     Chest wall: No tenderness.  Musculoskeletal:        General: Normal range of motion.     Cervical back: Normal range of motion and neck supple.  Skin:    General: Skin is warm and dry.     Capillary Refill: Capillary refill takes less than 2 seconds.     Coloration: Skin is not jaundiced or pale.  Findings: No bruising, erythema, lesion or rash.  Neurological:     General: No focal deficit present.     Mental Status: He is alert and oriented to person, place, and time. Mental status is at baseline.  Psychiatric:        Mood and Affect: Mood normal.        Behavior: Behavior normal.        Thought Content: Thought content normal.        Judgment: Judgment normal.     Results for orders placed or performed in visit on 06/15/24  Bayer DCA Hb A1c Waived   Collection Time: 06/15/24  4:29 PM  Result Value Ref Range   HB A1C (BAYER DCA - WAIVED) 7.0 (H) 4.8 - 5.6 %  Microalbumin, Urine Waived   Collection Time: 06/15/24  4:29 PM  Result Value Ref Range   Microalb, Ur Waived 30 (H) 0 - 19 mg/L   Creatinine, Urine Waived 100 10 - 300 mg/dL   Microalb/Creat Ratio <30 <30 mg/g  Comprehensive metabolic panel with GFR   Collection Time: 06/15/24  4:30  PM  Result Value Ref Range   Glucose 123 (H) 70 - 99 mg/dL   BUN 12 6 - 24 mg/dL   Creatinine, Ser 9.09 0.76 - 1.27 mg/dL   eGFR 896 >40 fO/fpw/8.26   BUN/Creatinine Ratio 13 9 - 20   Sodium 137 134 - 144 mmol/L   Potassium 4.2 3.5 - 5.2 mmol/L   Chloride 99 96 - 106 mmol/L   CO2 22 20 - 29 mmol/L   Calcium 10.1 8.7 - 10.2 mg/dL   Total Protein 7.1 6.0 - 8.5 g/dL   Albumin 4.0 3.8 - 4.9 g/dL   Globulin, Total 3.1 1.5 - 4.5 g/dL   Bilirubin Total 0.5 0.0 - 1.2 mg/dL   Alkaline Phosphatase 69 44 - 121 IU/L   AST 24 0 - 40 IU/L   ALT 31 0 - 44 IU/L  CBC with Differential/Platelet   Collection Time: 06/15/24  4:30 PM  Result Value Ref Range   WBC 8.8 3.4 - 10.8 x10E3/uL   RBC 5.77 4.14 - 5.80 x10E6/uL   Hemoglobin 17.5 13.0 - 17.7 g/dL   Hematocrit 48.5 (H) 62.4 - 51.0 %   MCV 89 79 - 97 fL   MCH 30.3 26.6 - 33.0 pg   MCHC 34.0 31.5 - 35.7 g/dL   RDW 86.4 88.3 - 84.5 %   Platelets 192 150 - 450 x10E3/uL   Neutrophils 51 Not Estab. %   Lymphs 35 Not Estab. %   Monocytes 9 Not Estab. %   Eos 4 Not Estab. %   Basos 1 Not Estab. %   Neutrophils Absolute 4.5 1.4 - 7.0 x10E3/uL   Lymphocytes Absolute 3.1 0.7 - 3.1 x10E3/uL   Monocytes Absolute 0.8 0.1 - 0.9 x10E3/uL   EOS (ABSOLUTE) 0.4 0.0 - 0.4 x10E3/uL   Basophils Absolute 0.1 0.0 - 0.2 x10E3/uL   Immature Granulocytes 0 Not Estab. %   Immature Grans (Abs) 0.0 0.0 - 0.1 x10E3/uL  Lipid Panel w/o Chol/HDL Ratio   Collection Time: 06/15/24  4:30 PM  Result Value Ref Range   Cholesterol, Total 216 (H) 100 - 199 mg/dL   Triglycerides 790 (H) 0 - 149 mg/dL   HDL 35 (L) >60 mg/dL   VLDL Cholesterol Cal 38 5 - 40 mg/dL   LDL Chol Calc (NIH) 856 (H) 0 - 99 mg/dL  PSA   Collection Time: 06/15/24  4:30 PM  Result Value Ref Range   Prostate Specific Ag, Serum 0.6 0.0 - 4.0 ng/mL  TSH   Collection Time: 06/15/24  4:30 PM  Result Value Ref Range   TSH 2.050 0.450 - 4.500 uIU/mL      Assessment & Plan:   Problem List Items  Addressed This Visit       Endocrine   Type 2 diabetes mellitus with other specified complication (HCC) - Primary   Sugars dropping occasionally. Hgb too high to check in house A1c. Will send out and treat as needed. Call with any concerns.       Relevant Orders   Bayer DCA Hb A1c Waived   Hgb A1c w/o eAG     Follow up plan: Return in about 3 months (around 12/10/2024).

## 2024-09-10 NOTE — Assessment & Plan Note (Signed)
 Sugars dropping occasionally. Hgb too high to check in house A1c. Will send out and treat as needed. Call with any concerns.

## 2024-09-11 LAB — HGB A1C W/O EAG: Hgb A1c MFr Bld: 6.7 % — ABNORMAL HIGH (ref 4.8–5.6)

## 2024-09-17 ENCOUNTER — Ambulatory Visit: Payer: Self-pay | Admitting: Family Medicine

## 2024-09-21 ENCOUNTER — Ambulatory Visit: Admitting: Family Medicine

## 2024-12-15 ENCOUNTER — Encounter: Payer: Self-pay | Admitting: Family Medicine

## 2024-12-15 ENCOUNTER — Ambulatory Visit: Admitting: Family Medicine

## 2024-12-15 VITALS — BP 122/75 | HR 83 | Temp 98.0°F | Ht 75.0 in | Wt 277.4 lb

## 2024-12-15 DIAGNOSIS — E7849 Other hyperlipidemia: Secondary | ICD-10-CM | POA: Diagnosis not present

## 2024-12-15 DIAGNOSIS — Z23 Encounter for immunization: Secondary | ICD-10-CM

## 2024-12-15 DIAGNOSIS — E1129 Type 2 diabetes mellitus with other diabetic kidney complication: Secondary | ICD-10-CM | POA: Diagnosis not present

## 2024-12-15 DIAGNOSIS — R809 Proteinuria, unspecified: Secondary | ICD-10-CM

## 2024-12-15 DIAGNOSIS — Z7984 Long term (current) use of oral hypoglycemic drugs: Secondary | ICD-10-CM | POA: Diagnosis not present

## 2024-12-15 MED ORDER — TADALAFIL 5 MG PO TABS: 5.0000 mg | ORAL_TABLET | Freq: Every day | ORAL | 1 refills | Status: AC

## 2024-12-15 MED ORDER — ALBUTEROL SULFATE (2.5 MG/3ML) 0.083% IN NEBU: 2.5000 mg | INHALATION_SOLUTION | Freq: Four times a day (QID) | RESPIRATORY_TRACT | 1 refills | Status: AC | PRN

## 2024-12-15 MED ORDER — LISINOPRIL 5 MG PO TABS: 5.0000 mg | ORAL_TABLET | Freq: Every day | ORAL | 1 refills | Status: AC

## 2024-12-15 MED ORDER — BACLOFEN 10 MG PO TABS: 10.0000 mg | ORAL_TABLET | Freq: Every day | ORAL | 1 refills | Status: AC | PRN

## 2024-12-15 MED ORDER — RYBELSUS 14 MG PO TABS: 14.0000 mg | ORAL_TABLET | Freq: Every day | ORAL | 1 refills | Status: AC

## 2024-12-15 NOTE — Progress Notes (Signed)
 "  BP 122/75   Pulse 83   Temp 98 F (36.7 C) (Oral)   Ht 6' 3 (1.905 m)   Wt 277 lb 6.4 oz (125.8 kg)   SpO2 98%   BMI 34.67 kg/m    Subjective:    Patient ID: Ethan Perez, male    DOB: [birth year], 53 y.o.   MRN: [id number]  HPI: AKSHAJ BESANCON is a 53 y.o. male  Chief Complaint  Patient presents with   Diabetes   Hypertension   Hyperlipidemia   DIABETES Hypoglycemic episodes:no Polydipsia/polyuria: no Visual disturbance: no Chest pain: no Paresthesias: no Glucose Monitoring: yes Taking Insulin?: no Blood Pressure Monitoring: not checking Retinal Examination: Not up to Date Foot Exam: Not up to Date Diabetic Education: Completed Pneumovax: Up to Date Influenza: Not up to Date Aspirin: no  HYPERTENSION / HYPERLIPIDEMIA Satisfied with current treatment? yes Duration of hypertension: chronic BP monitoring frequency: not checking BP medication side effects: no Past BP meds: lisinopril ,  Duration of hyperlipidemia: chronic Cholesterol medication side effects: N/A Cholesterol supplements: none Past cholesterol medications: none Medication compliance: excellent compliance Aspirin: no Recent stressors: no Recurrent headaches: no Visual changes: no Palpitations: no Dyspnea: no Chest pain: no Lower extremity edema: no Dizzy/lightheaded: no   Relevant past medical, surgical, family and social history reviewed and updated as indicated. Interim medical history since our last visit reviewed. Allergies and medications reviewed and updated.  Review of Systems  Constitutional: Negative.   Respiratory: Negative.    Cardiovascular: Negative.   Gastrointestinal: Negative.   Musculoskeletal: Negative.   Neurological: Negative.   Psychiatric/Behavioral: Negative.      Per HPI unless specifically indicated above     Objective:    BP 122/75   Pulse 83   Temp 98 F (36.7 C) (Oral)   Ht 6' 3 (1.905 m)   Wt 277 lb 6.4 oz (125.8 kg)   SpO2 98%    BMI 34.67 kg/m   Wt Readings from Last 3 Encounters:  12/15/24 277 lb 6.4 oz (125.8 kg)  09/10/24 281 lb 9.6 oz (127.7 kg)  06/15/24 283 lb (128.4 kg)    Physical Exam Vitals and nursing note reviewed.  Constitutional:      General: He is not in acute distress.    Appearance: Normal appearance. He is not ill-appearing, toxic-appearing or diaphoretic.  HENT:     Head: Normocephalic and atraumatic.     Right Ear: External ear normal.     Left Ear: External ear normal.     Nose: Nose normal.     Mouth/Throat:     Mouth: Mucous membranes are moist.     Pharynx: Oropharynx is clear.  Eyes:     General: No scleral icterus.       Right eye: No discharge.        Left eye: No discharge.     Extraocular Movements: Extraocular movements intact.     Conjunctiva/sclera: Conjunctivae normal.     Pupils: Pupils are equal, round, and reactive to light.  Cardiovascular:     Rate and Rhythm: Normal rate and regular rhythm.     Pulses: Normal pulses.     Heart sounds: Normal heart sounds. No murmur heard.    No friction rub. No gallop.  Pulmonary:     Effort: Pulmonary effort is normal. No respiratory distress.     Breath sounds: Normal breath sounds. No stridor. No wheezing, rhonchi or rales.  Chest:     Chest wall: No tenderness.  Musculoskeletal:        General: Normal range of motion.     Cervical back: Normal range of motion and neck supple.  Skin:    General: Skin is warm and dry.     Capillary Refill: Capillary refill takes less than 2 seconds.     Coloration: Skin is not jaundiced or pale.     Findings: No bruising, erythema, lesion or rash.  Neurological:     General: No focal deficit present.     Mental Status: He is alert and oriented to person, place, and time. Mental status is at baseline.  Psychiatric:        Mood and Affect: Mood normal.        Behavior: Behavior normal.        Thought Content: Thought content normal.        Judgment: Judgment normal.     Results  for orders placed or performed in visit on 09/10/24  Hgb A1c w/o eAG   Collection Time: 09/10/24  2:38 PM  Result Value Ref Range   Hgb A1c MFr Bld 6.7 (H) 4.8 - 5.6 %      Assessment & Plan:   Problem List Items Addressed This Visit       Endocrine   Type 2 diabetes mellitus with other specified complication (HCC) - Primary   Doing well with A1c of 6.0. Continue current regimen. Continue to monitor. Call with any concerns.       Relevant Medications   lisinopril  (ZESTRIL ) 5 MG tablet   Semaglutide  (RYBELSUS ) 14 MG TABS     Other   Hyperlipidemia (Chronic)   Rechecking labs today. Await results. Treat as needed.       Relevant Medications   lisinopril  (ZESTRIL ) 5 MG tablet   tadalafil  (CIALIS ) 5 MG tablet   Other Relevant Orders   CBC with Differential/Platelet   Comprehensive metabolic panel with GFR   Lipid Panel w/o Chol/HDL Ratio   Other Visit Diagnoses       Need for shingles vaccine       Shingles shot #2 given today.   Relevant Orders   Varicella-zoster vaccine IM (Shingrix )        Follow up plan: Return in about 6 months (around 06/15/2025) for physical.      "

## 2024-12-15 NOTE — Assessment & Plan Note (Signed)
Doing well with A1c of 6.0. Continue current regimen. Continue to monitor. Call with any concerns.

## 2024-12-15 NOTE — Assessment & Plan Note (Signed)
 Rechecking labs today. Await results. Treat as needed.

## 2024-12-16 ENCOUNTER — Ambulatory Visit: Payer: Self-pay | Admitting: Family Medicine

## 2024-12-16 LAB — CBC WITH DIFFERENTIAL/PLATELET
Basophils Absolute: 0.1 x10E3/uL (ref 0.0–0.2)
Basos: 1 %
EOS (ABSOLUTE): 0.5 x10E3/uL — ABNORMAL HIGH (ref 0.0–0.4)
Eos: 6 %
Hematocrit: 49.5 % (ref 37.5–51.0)
Hemoglobin: 16.7 g/dL (ref 13.0–17.7)
Immature Grans (Abs): 0 x10E3/uL (ref 0.0–0.1)
Immature Granulocytes: 0 %
Lymphocytes Absolute: 2.8 x10E3/uL (ref 0.7–3.1)
Lymphs: 33 %
MCH: 29.8 pg (ref 26.6–33.0)
MCHC: 33.7 g/dL (ref 31.5–35.7)
MCV: 88 fL (ref 79–97)
Monocytes Absolute: 0.7 x10E3/uL (ref 0.1–0.9)
Monocytes: 8 %
Neutrophils Absolute: 4.4 x10E3/uL (ref 1.4–7.0)
Neutrophils: 52 %
Platelets: 232 x10E3/uL (ref 150–450)
RBC: 5.61 x10E6/uL (ref 4.14–5.80)
RDW: 12.6 % (ref 11.6–15.4)
WBC: 8.5 x10E3/uL (ref 3.4–10.8)

## 2024-12-16 LAB — LIPID PANEL W/O CHOL/HDL RATIO
Cholesterol, Total: 190 mg/dL (ref 100–199)
HDL: 41 mg/dL
LDL Chol Calc (NIH): 103 mg/dL — ABNORMAL HIGH (ref 0–99)
Triglycerides: 267 mg/dL — ABNORMAL HIGH (ref 0–149)
VLDL Cholesterol Cal: 46 mg/dL — ABNORMAL HIGH (ref 5–40)

## 2024-12-16 LAB — COMPREHENSIVE METABOLIC PANEL WITH GFR
ALT: 29 IU/L (ref 0–44)
AST: 23 IU/L (ref 0–40)
Albumin: 4.5 g/dL (ref 3.8–4.9)
Alkaline Phosphatase: 61 IU/L (ref 47–123)
BUN/Creatinine Ratio: 13 (ref 9–20)
BUN: 12 mg/dL (ref 6–24)
Bilirubin Total: 0.6 mg/dL (ref 0.0–1.2)
CO2: 25 mmol/L (ref 20–29)
Calcium: 9.7 mg/dL (ref 8.7–10.2)
Chloride: 100 mmol/L (ref 96–106)
Creatinine, Ser: 0.94 mg/dL (ref 0.76–1.27)
Globulin, Total: 2.9 g/dL (ref 1.5–4.5)
Glucose: 116 mg/dL — ABNORMAL HIGH (ref 70–99)
Potassium: 4 mmol/L (ref 3.5–5.2)
Sodium: 138 mmol/L (ref 134–144)
Total Protein: 7.4 g/dL (ref 6.0–8.5)
eGFR: 97 mL/min/1.73

## 2024-12-16 LAB — PSA: Prostate Specific Ag, Serum: 0.6 ng/mL (ref 0.0–4.0)

## 2024-12-16 LAB — BAYER DCA HB A1C WAIVED: HB A1C (BAYER DCA - WAIVED): 6 % — ABNORMAL HIGH (ref 4.8–5.6)

## 2025-04-06 ENCOUNTER — Ambulatory Visit: Admitting: Dermatology

## 2025-06-15 ENCOUNTER — Encounter: Admitting: Family Medicine
